# Patient Record
Sex: Female | Born: 1980 | Race: White | Hispanic: No | Marital: Married | Smoking: Never smoker
Health system: Southern US, Community
[De-identification: ages and names within clinical notes are randomized; demographics above are authoritative.]

## PROBLEM LIST (undated history)

## (undated) DIAGNOSIS — R3129 Other microscopic hematuria: Secondary | ICD-10-CM

## (undated) DIAGNOSIS — J45909 Unspecified asthma, uncomplicated: Secondary | ICD-10-CM

## (undated) DIAGNOSIS — E041 Nontoxic single thyroid nodule: Secondary | ICD-10-CM

## (undated) HISTORY — DX: Nontoxic single thyroid nodule: E04.1

## (undated) HISTORY — DX: Other microscopic hematuria: R31.29

---
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
## 2005-01-08 ENCOUNTER — Other Ambulatory Visit: Admission: RE | Admit: 2005-01-08 | Discharge: 2005-01-08 | Payer: Self-pay | Admitting: *Deleted

## 2005-01-19 ENCOUNTER — Other Ambulatory Visit: Admission: RE | Admit: 2005-01-19 | Discharge: 2005-01-19 | Payer: Self-pay | Admitting: Family Medicine

## 2006-02-06 ENCOUNTER — Other Ambulatory Visit: Admission: RE | Admit: 2006-02-06 | Discharge: 2006-02-06 | Payer: Self-pay | Admitting: *Deleted

## 2007-01-01 ENCOUNTER — Other Ambulatory Visit: Admission: RE | Admit: 2007-01-01 | Discharge: 2007-01-01 | Payer: Self-pay | Admitting: Unknown Physician Specialty

## 2007-05-28 ENCOUNTER — Other Ambulatory Visit: Admission: RE | Admit: 2007-05-28 | Discharge: 2007-05-28 | Payer: Self-pay | Admitting: *Deleted

## 2008-06-03 ENCOUNTER — Other Ambulatory Visit: Admission: RE | Admit: 2008-06-03 | Discharge: 2008-06-03 | Payer: Self-pay | Admitting: Family Medicine

## 2009-07-28 LAB — CONVERTED CEMR LAB: Pap Smear: NORMAL

## 2009-07-29 ENCOUNTER — Other Ambulatory Visit: Admission: RE | Admit: 2009-07-29 | Discharge: 2009-07-29 | Payer: Self-pay | Admitting: Family Medicine

## 2010-07-24 ENCOUNTER — Other Ambulatory Visit: Admission: RE | Admit: 2010-07-24 | Discharge: 2010-07-24 | Payer: Self-pay | Admitting: Family Medicine

## 2010-07-24 ENCOUNTER — Ambulatory Visit: Payer: Self-pay | Admitting: Family Medicine

## 2010-07-24 DIAGNOSIS — E041 Nontoxic single thyroid nodule: Secondary | ICD-10-CM

## 2010-07-24 HISTORY — DX: Nontoxic single thyroid nodule: E04.1

## 2010-07-24 LAB — HM PAP SMEAR

## 2010-07-24 LAB — CONVERTED CEMR LAB
HDL: 42.2 mg/dL (ref >39.00)
Hgb A1c MFr Bld: 5.4 % (ref 4.6–6.5)
Total CHOL/HDL Ratio: 5
Triglycerides: 100 mg/dL (ref 0.0–149.0)
VLDL: 20 mg/dL (ref 0.0–40.0)

## 2010-07-27 ENCOUNTER — Encounter: Payer: Self-pay | Admitting: Family Medicine

## 2010-07-27 ENCOUNTER — Encounter: Admission: RE | Admit: 2010-07-27 | Discharge: 2010-07-27 | Payer: Self-pay | Admitting: Family Medicine

## 2010-09-13 ENCOUNTER — Telehealth: Payer: Self-pay | Admitting: Family Medicine

## 2010-10-24 NOTE — Assessment & Plan Note (Signed)
Summary: NEW PT TO EST/CPX/CLE   Vital Signs:  Patient profile:   30 year old female Height:      68 inches Weight:      120 pounds BMI:     18.31 Temp:     98.0 degrees F oral Pulse rate:   68 / minute Pulse rhythm:   regular BP sitting:   110 / 70  (left arm) Cuff size:   regular  Vitals Entered By: Linde Gillis CMA Duncan Dull) (July 24, 2010 9:55 AM) CC: new patient, establish care   History of Present Illness: 30 yo here to establish care and for CPX.  1.  Thyroid cyst- per pt, found in 2005.  Was being followed every six months by ultrasound but it shrank.  Pt has not had an ultrasound in at least a year.  Denies any dysphagia but she is concerned that she is having symptoms of thyroid irregularity- unintentional 7 pound weight loss in last 3 months, hot flashes, increased frequency of stools.  Denies any palpitations, anxiety or mood changes.   Sister has h/o hypothyroidism.  2.  Well woman- has been on OCPs for years.  She and her husband are talking about starting a family soon. h/o abnormal pap smear less than five years ago, most recent ones have been normal.  Denies any vaginal discharge, dysuria or pain with intercourse.    Preventive Screening-Counseling & Management  Alcohol-Tobacco     Smoking Status: never      Drug Use:  no.    Current Medications (verified): 1)  Junel 1.5/30 1.5-30 Mg-Mcg Tabs (Norethindrone Acet-Ethinyl Est) .... Take As Directed 2)  Ranitidine Hcl 150 Mg Tabs (Ranitidine Hcl) .... Take One Tablet By Mouth Daily  Allergies (verified): 1)  ! 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 Married Never Smoked Alcohol use-no Drug use-no Smoking Status:  never Drug Use:  no  Review of Systems      See HPI General:  Denies fever and malaise. Eyes:  Denies blurring. ENT:  Denies difficulty swallowing. CV:  Denies chest pain or discomfort, near fainting, and palpitations. Resp:  Denies shortness of breath. GI:  Complains of diarrhea; denies abdominal pain and bloody stools. GU:  Denies abnormal vaginal bleeding and dysuria. MS:  Denies joint pain, joint redness, and joint swelling. Derm:  Denies rash. Neuro:  Denies headaches. Psych:  Denies anxiety and depression. Endo:  Complains of heat intolerance and weight change; denies polyuria. Heme:  Denies abnormal bruising and bleeding.  Physical Exam  General:  alert, well-developed, well-nourished, and well-hydrated.   Head:  normocephalic and atraumatic.   Eyes:  vision grossly intact, pupils equal, pupils round, and pupils reactive to light.   Ears:  R ear normal and L ear normal.   Nose:  no external deformity.   Mouth:  good dentition and no gingival abnormalities.   Neck:  No deformities, masses, or tenderness noted. (unable to palpate a mass) Lungs:  Normal respiratory effort, chest expands symmetrically. Lungs are clear to auscultation, no crackles or wheezes. Heart:  Normal rate and regular rhythm. S1 and S2 normal without gallop, murmur, click, rub or other  extra sounds. Abdomen:  Bowel sounds positive,abdomen soft and non-tender without masses, organomegaly or hernias noted. Rectal:  no external abnormalities and no hemorrhoids.   Genitalia:  Pelvic Exam:        External: normal female genitalia without lesions or masses        Vagina: normal without lesions or masses        Cervix: normal without lesions or masses        Adnexa: normal bimanual exam without masses or fullness        Uterus: normal by palpation        Pap smear:  performed Msk:  No deformity or scoliosis noted of thoracic or lumbar spine.   Extremities:  no edema Neurologic:  alert & oriented X3 and gait normal.   Skin:  Intact without suspicious lesions or rashes Psych:  Cognition and judgment appear intact. Alert and cooperative with normal attention span and concentration. No apparent delusions, illusions, hallucinations   Impression & Recommendations:  Problem # 1:  Preventive Health Care (ICD-V70.0) Reviewed preventive care protocols, scheduled due services, and updated immunizations Discussed nutrition, exercise, diet, and healthy lifestyle.  Pap smear today. FLP, a1c today.    Problem # 2:  THYROID CYST (ICD-246.2) Assessment: Unchanged will send for ultrasound. Orders: Radiology Referral (Radiology)  Problem # 3:  R/O HYPERTHYROIDISM (ICD-242.90) Assessment: New TSH, T3, FT3 today.   Orders: Venipuncture (82956) TLB-TSH (Thyroid Stimulating Hormone) (84443-TSH) TLB-T3, Free (Triiodothyronine) (84481-T3FREE) TLB-T4 (Thyrox), Free [phone])  Complete Medication List: 1)  Junel 1.5/30 1.5-30 Mg-mcg Tabs (Norethindrone acet-ethinyl est) .... Take as directed 2)  Ranitidine Hcl 150 Mg Tabs (Ranitidine hcl) .... Take one tablet by mouth daily  Other Orders: TLB-Lipid Panel (80061-LIPID) TLB-A1C / Hgb A1C (Glycohemoglobin) (83036-A1C)  Patient Instructions: 1)  Great to meet you, Baxter Hire. 2)  Please stop by to see Shirlee Limerick on your way out. Prescriptions: JUNEL 1.5/30 1.5-30 MG-MCG TABS (NORETHINDRONE ACET-ETHINYL EST) take as directed  #1 x 11   Entered and Authorized by:   Ruthe Mannan MD   Signed by:   Ruthe Mannan MD on 07/24/2010   Method used:   Electronically to        [facility]  [address] [id number] (retail)       [address]       [place], [place]  [address]       Ph: [phone] or [phone]       Fax: [phone]   RxID:   [id number]    Orders Added: 1)  Venipuncture [36415] 2)  TLB-TSH (Thyroid  Stimulating Hormone) [84443-TSH] 3)  TLB-T3, Free (Triiodothyronine) [06301-S0FUXN] 4)  TLB-T4 (Thyrox), Free [23557-DU2G] 5)  TLB-Lipid Panel [80061-LIPID] 6)  TLB-A1C / Hgb A1C (Glycohemoglobin) [83036-A1C] 7)  Radiology Referral [Radiology] 8)  New Patient 18-39 years [99385] 63)  New Patient Level III [99203]    Current Allergies (reviewed today): ! PENICILLIN  Flu Vaccine Result Date:  07/24/2010 Flu Vaccine Result:  given TD Result Date:  07/17/2005 TD Result:  historical PAP Result Date:  07/28/2009 PAP Result:  normal- historical   Appended Document: NEW PT TO EST/CPX/CLE     Allergies: 1)  ! Penicillin   Complete Medication List: 1)  Junel 1.5/30 1.5-30 Mg-mcg Tabs (Norethindrone acet-ethinyl est) .... Take as directed 2)  Ranitidine Hcl 150 Mg Tabs (Ranitidine hcl) .... Take one tablet by mouth daily  Other Orders: Admin 1st Vaccine (25427) Flu Vaccine 43yrs + (06237)   Orders Added: 1)  Admin 1st Vaccine [  90471] 2)  Flu Vaccine 57yrs + [09811]    Flu Vaccine Consent Questions     Do you have a history of severe allergic reactions to this vaccine? no    Any prior history of allergic reactions to egg and/or gelatin? no    Do you have a sensitivity to the preservative Thimersol? no    Do you have a past history of Guillan-Barre Syndrome? no    Do you currently have an acute febrile illness? no    Have you ever had a severe reaction to latex? no    Vaccine information given and explained to patient? yes    Are you currently pregnant? no    Lot Number:[id number]   Exp Date:03/24/2011   Site Given  Left Deltoid [email or web address]  Appended Document: NEW PT TO EST/CPX/CLE Flu vaccination given in right arm.

## 2010-10-24 NOTE — Letter (Signed)
Summary: Results Follow up Letter  [facility]  [address] [place], [place] [address]   Phone: [phone]  Fax: [phone]    07/27/2010 MRN: [id number]  Susan Hurst [address] [place], [place]  [address]  Dear Ms. Kouba,  The following are the results of your recent test(s):  Test         Result    Pap Smear:        Normal ___X__  Not Normal _____ Comments: ______________________________________________________ Cholesterol: LDL(Bad cholesterol):         Your goal is less than:         HDL (Good cholesterol):       Your goal is more than: Comments:  ______________________________________________________ Mammogram:        Normal _____  Not Normal _____ Comments:  ___________________________________________________________________ Hemoccult:        Normal _____  Not normal _______ Comments:    _____________________________________________________________________ Other Tests:    We routinely do not discuss normal results over the telephone.  If you desire a copy of the results, or you have any questions about this information we can discuss them at your next office visit.   Sincerely,      Ruthe Mannan, MD

## 2010-10-24 NOTE — Letter (Signed)
Summary: Generic Letter  [place] [facility]  [address] [place], [place] [address]   Phone: [phone]  Fax: [phone]    07/24/2010  Susan Hurst [address] [place], [place]  [address]  Dear [name],    We have received your lab results and Dr. Dayton Martes says that your thyroid function is normal.  Triglycerides and HDL (good cholesterol) is great.  LDL (bad cholesterol) is a little elevated, normal for your age and risk factors.           Sincerely,       Linde Gillis CMA (AAMA)for Dr. Ruthe Mannan

## 2010-10-24 NOTE — Letter (Signed)
Summary: Records Dated 06-03-08 thru 07-29-09/[facility]  Records Dated 06-03-08 thru 07-29-09/[facility]   Imported By: Lanelle Bal 08/03/2010 10:39:11  _____________________________________________________________________  External Attachment:    Type:   Image     Comment:   External Document

## 2010-10-26 NOTE — Progress Notes (Signed)
Summary: strange odor to urine  Phone Note Call from Patient Call back at Home Phone [phone]   Caller: Patient Call For: Ruthe Mannan MD Summary of Call: Pt states she has had a bad odor to her urine for about 3 months.  She has no other symptoms- no burning, pain, frequency.  She describes this as an ammonia smell.  The only thing she has changed in her diet is the vitamins that she takes- changed brands back in the summer.  Please advise. Initial call taken by: Lowella Petties CMA, AAMA,  September 13, 2010 4:12 PM  Follow-up for Phone Call        Likely due to the vitamis but I can see her next week to check a UA to reassure her. Ruthe Mannan MD  September 14, 2010 5:11 AM  [facility] for pt to call.              Lowella Petties CMA, AAMA  September 14, 2010 10:37 AM  [facility] for pt to call to schedule appt.           Lowella Petties CMA, AAMA  September 14, 2010 2:32 PM

## 2010-11-09 ENCOUNTER — Ambulatory Visit: Payer: Self-pay | Admitting: Family Medicine

## 2010-11-10 ENCOUNTER — Ambulatory Visit (INDEPENDENT_AMBULATORY_CARE_PROVIDER_SITE_OTHER): Payer: BC Managed Care – PPO | Admitting: Family Medicine

## 2010-11-10 ENCOUNTER — Encounter: Payer: Self-pay | Admitting: Family Medicine

## 2010-11-10 DIAGNOSIS — R300 Dysuria: Secondary | ICD-10-CM

## 2010-11-10 DIAGNOSIS — R634 Abnormal weight loss: Secondary | ICD-10-CM | POA: Insufficient documentation

## 2010-11-10 DIAGNOSIS — R3129 Other microscopic hematuria: Secondary | ICD-10-CM

## 2010-11-10 HISTORY — DX: Other microscopic hematuria: R31.29

## 2010-11-10 LAB — CONVERTED CEMR LAB
Ketones, urine, test strip: NEGATIVE
Nitrite: NEGATIVE
Urobilinogen, UA: 0.2

## 2010-11-11 ENCOUNTER — Encounter: Payer: Self-pay | Admitting: Family Medicine

## 2010-11-14 ENCOUNTER — Encounter (INDEPENDENT_AMBULATORY_CARE_PROVIDER_SITE_OTHER): Payer: Self-pay | Admitting: *Deleted

## 2010-11-14 LAB — CONVERTED CEMR LAB
AST: 11 units/L (ref 0–37)
Albumin: 4.6 g/dL (ref 3.5–5.2)
BUN: 8 mg/dL (ref 6–23)
Basophils Relative: 0 % (ref 0–1)
Bilirubin, Direct: 0.1 mg/dL (ref 0.0–0.3)
Calcium: 9.3 mg/dL (ref 8.4–10.5)
Cortisol, Plasma: 15.1 ug/dL
Creatinine, Ser: 0.75 mg/dL (ref 0.40–1.20)
Eosinophils Absolute: 0 10*3/uL (ref 0.0–0.7)
Eosinophils Relative: 1 % (ref 0–5)
Glucose, Bld: 96 mg/dL (ref 70–99)
HCT: 43.4 % (ref 36.0–46.0)
Hgb A1c MFr Bld: 4.9 % (ref <5.7)
Lymphs Abs: 2.1 10*3/uL (ref 0.7–4.0)
MCHC: 34.1 g/dL (ref 30.0–36.0)
MCV: 92.3 fL (ref 78.0–100.0)
Monocytes Relative: 8 % (ref 3–12)
Neutrophils Relative %: 59 % (ref 43–77)
Potassium: 4.3 meq/L (ref 3.5–5.3)
RBC: 4.7 M/uL (ref 3.87–5.11)
TSH: 1.111 microintl units/mL (ref 0.350–4.500)
Total Bilirubin: 0.6 mg/dL (ref 0.3–1.2)

## 2010-11-15 NOTE — Assessment & Plan Note (Signed)
Summary: Susan Hurst SMELL IN URINE/CLE   Vital Signs:  Patient profile:   30 year old female Height:      68 inches Weight:      119.50 pounds BMI:     18.24 Temp:     97.9 degrees F oral Pulse rate:   76 / minute Pulse rhythm:   regular BP sitting:   102 / 78  (right arm) Cuff size:   regular  Vitals Entered By: Linde Gillis CMA Duncan Dull) (November 10, 2010 2:39 PM) CC: losing weight, urine odor   History of Present Illness: 30 yo here for weight loss and foul smelling urine.  Weight is stable since her last appointment in October according to our scale but per pt's home scale, she has lost 5 pounds.  She has been concerned about gradually losing weight over the past year.   Has a h/o thyroid cyst that was stable on ultrasound in october 2011. Labs in October 2011 were also within normal limits including TSH, T3, FT4, a1c and lipid panel.  Foul smelling urine- has noticed it for past several months.  Thought it was her MVI but stopped taking it and still experiencing a strong smell.  No dysuria but has had increased urinary frequency. No vaginal discharge.    Current Medications (verified): 1)  Junel 1.5/30 1.5-30 Mg-Mcg Tabs (Norethindrone Acet-Ethinyl Est) .... Take As Directed 2)  Ranitidine Hcl 150 Mg Tabs (Ranitidine Hcl) .... Take One Tablet By Mouth Daily  Allergies: 1)  ! 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 CV:  Denies chest pain or discomfort. Resp:  Denies shortness of breath. Derm:  Denies  rash. Endo:  Denies cold intolerance and heat intolerance.  Physical Exam  General:  alert, well-developed, well-nourished, and well-hydrated.   Neck:  No deformities, masses, or tenderness noted. Lungs:  Normal respiratory effort, chest expands symmetrically. Lungs are clear to auscultation, no crackles or wheezes. Heart:  Normal rate and regular rhythm. S1 and S2 normal without gallop, murmur, click, rub or other extra sounds. Abdomen:  Bowel sounds positive,abdomen soft and non-tender without masses, organomegaly or hernias noted. Skin:  Intact without suspicious lesions or rashes Psych:  Cognition and judgment appear intact. Alert and cooperative with normal attention span and concentration. No apparent delusions, illusions, hallucinations   Impression & Recommendations:  Problem # 1:  LOSS OF WEIGHT (ICD-783.21) Assessment New etiology unknown and weight stable according to our scales. will recheck labs today.  No changes in bowels or feeling full, no GI referral necessary at this point. ?type of food insensitivity, i.e gluten. Orders: Venipuncture ([address]) TLB-BMP (Basic Metabolic Panel-BMET) (80048-METABOL) TLB-CBC Platelet - w/Differential (85025-CBCD) TLB-TSH (Thyroid Stimulating Hormone) (84443-TSH) TLB-Cortisol (82533-CORT) TLB-A1C / Hgb A1C (Glycohemoglobin) (83036-A1C) TLB-Hepatic/Liver Function Pnl (80076-HEPATIC)  Problem # 2:  MICROSCOPIC HEMATURIA (ICD-599.72) Assessment: New with foul smelling urine. will send urine for culture and refuer to urology to rule out bladder polyps, IC, etc. Orders: Urology Referral (Urology)  Complete Medication List: 1)  Junel 1.5/30 1.5-30 Mg-mcg Tabs (Norethindrone acet-ethinyl est) .... Take as directed 2)  Ranitidine Hcl 150 Mg Tabs (  Ranitidine hcl) .... Take one tablet by mouth daily  Other Orders: UA Dipstick w/o Micro (manual) (44010) Specimen Handling (99000) T-Urine Culture (Spectrum Order) [phone])  Patient  Instructions: 1)  please stop by to see Shirlee Limerick on your way out.   Orders Added: 1)  UA Dipstick w/o Micro (manual) [81002] 2)  Specimen Handling [99000] 3)  T-Urine Culture (Spectrum Order) [[id number]] 4)  Urology Referral [Urology] 5)  Venipuncture [87564] 6)  TLB-BMP (Basic Metabolic Panel-BMET) [80048-METABOL] 7)  TLB-CBC Platelet - w/Differential [85025-CBCD] 8)  TLB-TSH (Thyroid Stimulating Hormone) [84443-TSH] 9)  TLB-Cortisol [82533-CORT] 10)  TLB-A1C / Hgb A1C (Glycohemoglobin) [83036-A1C] 11)  TLB-Hepatic/Liver Function Pnl [80076-HEPATIC] 12)  Est. Patient Level IV [33295]    Current Allergies (reviewed today): ! PENICILLIN  Laboratory Results   Urine Tests  Date/Time Received: November 10, 2010 2:54 PM   Routine Urinalysis   Color: yellow Appearance: Clear Glucose: negative   (Normal Range: Negative) Bilirubin: negative   (Normal Range: Negative) Ketone: negative   (Normal Range: Negative) Spec. Gravity: <1.005   (Normal Range: 1.003-1.035) Blood: trace-intact   (Normal Range: Negative) pH: 6.0   (Normal Range: 5.0-8.0) Protein: trace   (Normal Range: Negative) Urobilinogen: 0.2   (Normal Range: 0-1) Nitrite: negative   (Normal Range: Negative) Leukocyte Esterace: negative   (Normal Range: Negative)        Appended Document: LOSING WEIGHT,STRONG SMELL IN URINE/CLE

## 2010-11-16 ENCOUNTER — Encounter: Payer: Self-pay | Admitting: Family Medicine

## 2010-11-21 NOTE — Miscellaneous (Signed)
Summary: med list update, bactrim  Clinical Lists Changes  Medications: Added new medication of BACTRIM DS 800-160 MG TABS (SULFAMETHOXAZOLE-TRIMETHOPRIM) take one by mouth twice a day times 3 days     Prior Medications: JUNEL 1.5/30 1.5-30 MG-MCG TABS (NORETHINDRONE ACET-ETHINYL EST) take as directed RANITIDINE HCL 150 MG TABS (RANITIDINE HCL) take one tablet by mouth daily Current Allergies: ! PENICILLIN

## 2010-12-05 NOTE — Letter (Signed)
Summary: [facility]  [facility]   Imported By: Kassie Mends 11/27/2010 10:50:08  _____________________________________________________________________  External Attachment:    Type:   Image     Comment:   External Document  Appended Document: [facility] renal U/S normal. started on abx, return for cystoscopy.

## 2011-03-30 ENCOUNTER — Ambulatory Visit (INDEPENDENT_AMBULATORY_CARE_PROVIDER_SITE_OTHER): Payer: BC Managed Care – PPO | Admitting: Family Medicine

## 2011-03-30 ENCOUNTER — Encounter: Payer: Self-pay | Admitting: Family Medicine

## 2011-03-30 VITALS — BP 120/80 | HR 66 | Temp 98.9°F | Wt 121.8 lb

## 2011-03-30 DIAGNOSIS — N390 Urinary tract infection, site not specified: Secondary | ICD-10-CM | POA: Insufficient documentation

## 2011-03-30 LAB — POCT URINALYSIS DIPSTICK
Bilirubin, UA: NEGATIVE
Leukocytes, UA: NEGATIVE
Nitrite, UA: NEGATIVE
Protein, UA: NEGATIVE
Urobilinogen, UA: 0.2
pH, UA: 6

## 2011-03-30 MED ORDER — SULFAMETHOXAZOLE-TRIMETHOPRIM 800-160 MG PO TABS: 1.0000 | ORAL_TABLET | Freq: Two times a day (BID) | ORAL | Status: AC

## 2011-03-30 NOTE — Progress Notes (Signed)
Dysuria: foul odor in urine. Initially with burning with urination- now episodic.  No pressure or frequency duration of symptoms: ~1 month abdominal pain:no fevers:no back pain:no vomiting:no other concerns:no Prev neg cystoscopy prev.    Meds, vitals, and allergies reviewed.   ROS: See HPI.  Otherwise negative.    GEN: nad, alert and oriented HEENT: mucous membranes moist NECK: supple CV: rrr.  PULM: ctab, no inc wob ABD: soft, +bs, suprapubic area not tender EXT: no edema SKIN: no acute rash BACK: no CVA pain

## 2011-03-30 NOTE — Patient Instructions (Signed)
Start the antibiotics today and we'll notify you about the culture report.  Take care and drink plenty of fluids.

## 2011-04-01 NOTE — Assessment & Plan Note (Signed)
Check ucx, start abx and f/u prn.  D/w pt, she understood.

## 2011-04-02 LAB — URINE CULTURE

## 2011-07-04 ENCOUNTER — Other Ambulatory Visit: Payer: Self-pay | Admitting: Family Medicine

## 2011-08-23 ENCOUNTER — Encounter: Payer: Self-pay | Admitting: Family Medicine

## 2011-08-23 ENCOUNTER — Ambulatory Visit (INDEPENDENT_AMBULATORY_CARE_PROVIDER_SITE_OTHER): Payer: BC Managed Care – PPO | Admitting: Family Medicine

## 2011-08-23 VITALS — BP 128/80 | HR 65 | Temp 98.5°F | Wt 120.0 lb

## 2011-08-23 DIAGNOSIS — N390 Urinary tract infection, site not specified: Secondary | ICD-10-CM

## 2011-08-23 LAB — POCT URINALYSIS DIPSTICK
Bilirubin, UA: NEGATIVE
Glucose, UA: NEGATIVE
Ketones, UA: NEGATIVE
Nitrite, UA: NEGATIVE
pH, UA: 7.5

## 2011-08-23 MED ORDER — CIPROFLOXACIN HCL 500 MG PO TABS: 500.0000 mg | ORAL_TABLET | Freq: Two times a day (BID) | ORAL | Status: AC

## 2011-08-23 NOTE — Patient Instructions (Signed)

## 2011-08-23 NOTE — Progress Notes (Signed)
SUBJECTIVE: Susan Hurst is a 30 y.o. female who complains of urinary frequency, urgency and dysuria x 7  days, without flank pain, fever, chills, or abnormal vaginal discharge or bleeding.   Patient Active Problem List  Diagnoses  . THYROID CYST  . MICROSCOPIC HEMATURIA  . LOSS OF WEIGHT  . UTI (lower urinary tract infection)   Past Medical History  Diagnosis Date  . Thyroid cyst    No past surgical history on file. History  Substance Use Topics  . Smoking status: Never Smoker   . Smokeless tobacco: Not on file  . Alcohol Use: No   Family History  Problem Relation Age of Onset  . Hyperlipidemia Father   . Hypertension Father   . Polycystic ovary syndrome Sister   . Hypothyroidism Sister    Allergies  Allergen Reactions  . Penicillins     REACTION: hives, rash   Current Outpatient Prescriptions on File Prior to Visit  Medication Sig Dispense Refill  . JUNEL 1.5/30 1.5-30 MG-MCG tablet TAKE AS DIRECTED  21 tablet  11  . norethindrone-ethinyl estradiol-iron (JUNEL FE 1.5/30) 1.5-30 MG-MCG tablet Take 1 tablet by mouth daily.        . ranitidine (ZANTAC) 150 MG tablet Take 150 mg by mouth 2 (two) times daily.         The PMH, PSH, Social History, Family History, Medications, and allergies have been reviewed in [facility], and have been updated if relevant.  OBJECTIVE:  BP 128/80  Pulse 65  Temp(Src) 98.5 F (36.9 C) (Oral)  Wt 120 lb (54.432 kg)  LMP 07/23/2011  Appears well, in no apparent distress.  Vital signs are normal. The abdomen is soft without tenderness, guarding, mass, rebound or organomegaly. No CVA tenderness or inguinal adenopathy noted. Urine dipstick shows positive for RBC's, positive for protein and positive for leukocytes.  Micro exam: not done.   ASSESSMENT: UTI uncomplicated without evidence of pyelonephritis  PLAN: Treatment per orders - cipro 500 mg twice daily x 5 days, also push fluids, may use Pyridium OTC prn. Call or return to clinic prn if  these symptoms worsen or fail to improve as anticipated.

## 2011-08-25 LAB — URINE CULTURE

## 2011-08-29 ENCOUNTER — Encounter: Payer: BC Managed Care – PPO | Admitting: Family Medicine

## 2011-12-12 ENCOUNTER — Encounter: Payer: Self-pay | Admitting: Family Medicine

## 2011-12-12 ENCOUNTER — Ambulatory Visit (INDEPENDENT_AMBULATORY_CARE_PROVIDER_SITE_OTHER): Payer: BC Managed Care – PPO | Admitting: Family Medicine

## 2011-12-12 VITALS — BP 110/70 | HR 60 | Temp 98.0°F | Ht 68.0 in | Wt 117.0 lb

## 2011-12-12 DIAGNOSIS — Z136 Encounter for screening for cardiovascular disorders: Secondary | ICD-10-CM

## 2011-12-12 DIAGNOSIS — Z0000 Encounter for general adult medical examination without abnormal findings: Secondary | ICD-10-CM | POA: Insufficient documentation

## 2011-12-12 LAB — COMPREHENSIVE METABOLIC PANEL
Albumin: 4.2 g/dL (ref 3.5–5.2)
BUN: 10 mg/dL (ref 6–23)
Calcium: 9.1 mg/dL (ref 8.4–10.5)
Chloride: 103 mEq/L (ref 96–112)
Creatinine, Ser: 0.8 mg/dL (ref 0.4–1.2)
Glucose, Bld: 85 mg/dL (ref 70–99)
Potassium: 4 mEq/L (ref 3.5–5.1)

## 2011-12-12 LAB — LIPID PANEL
Cholesterol: 190 mg/dL (ref 0–200)
Triglycerides: 65 mg/dL (ref 0.0–149.0)
VLDL: 13 mg/dL (ref 0.0–40.0)

## 2011-12-12 NOTE — Progress Notes (Signed)
  Subjective:    Patient ID: Susan Hurst, female    DOB: 1980-10-02, 31 y.o.   MRN: [id number]  HPI 31 yo here to fill out wellness forms for work.  Doing well, no complaints. Brings form in with her today- needs glucose, lipid panel, cotitine labs.  Patient Active Problem List  Diagnoses  . THYROID CYST  . MICROSCOPIC HEMATURIA  . LOSS OF WEIGHT  . UTI (lower urinary tract infection)  . Routine general medical examination at a health care facility   Past Medical History  Diagnosis Date  . Thyroid cyst    No past surgical history on file. History  Substance Use Topics  . Smoking status: Never Smoker   . Smokeless tobacco: Not on file  . Alcohol Use: No   Family History  Problem Relation Age of Onset  . Hyperlipidemia Father   . Hypertension Father   . Polycystic ovary syndrome Sister   . Hypothyroidism Sister    Allergies  Allergen Reactions  . Penicillins     REACTION: hives, rash   Current Outpatient Prescriptions on File Prior to Visit  Medication Sig Dispense Refill  . JUNEL 1.5/30 1.5-30 MG-MCG tablet TAKE AS DIRECTED  21 tablet  11  . ranitidine (ZANTAC) 150 MG tablet Take 150 mg by mouth 2 (two) times daily.         The PMH, PSH, Social History, Family History, Medications, and allergies have been reviewed in [facility], and have been updated if relevant.  Review of Systems    See HPI Patient reports no  vision/ hearing changes,anorexia, weight change, fever ,adenopathy, persistant / recurrent hoarseness, swallowing issues, chest pain, edema,severe memory loss, concerning skin lesions, depression, anxiety, abnormal bruising/bleeding, major joint swelling, breast masses or abnormal vaginal bleeding.    Objective:   Physical Exam BP 110/70  Pulse 60  Temp(Src) 98 F (36.7 C) (Oral)  Ht 5\' 8"  (1.727 m)  Wt 117 lb (53.071 kg)  BMI 17.79 kg/m2  LMP 11/23/2011  General:  Well-developed,well-nourished,in no acute distress; alert,appropriate and cooperative  throughout examination Head:  normocephalic and atraumatic.   Eyes:  vision grossly intact, pupils equal, pupils round, and pupils reactive to light.   Ears:  R ear normal and L ear normal.   Nose:  no external deformity.   Mouth:  good dentition.   Neck:  No deformities, masses, or tenderness noted. Lungs:  Normal respiratory effort, chest expands symmetrically. Lungs are clear to auscultation, no crackles or wheezes. Heart:  Normal rate and regular rhythm. S1 and S2 normal without gallop, murmur, click, rub or other extra sounds. Abdomen:  Bowel sounds positive,abdomen soft and non-tender without masses, organomegaly or hernias noted. Neurologic:  alert & oriented X3 and gait normal.   Skin:  Intact without suspicious lesions or rashes Psych:  Cognition and judgment appear intact. Alert and cooperative with normal attention span and concentration. No apparent delusions, illusions, hallucinations      Assessment & Plan:   1. Routine general medical examination at a health care facility  Comprehensive metabolic panel, Nicotine/cotinine metabolites Lipid Panel  Labs ordered- waiting to fill out form until we receive results.

## 2011-12-12 NOTE — Patient Instructions (Signed)
We will fax your form to the number provided and send you copy with fax confirmation. Have a great day!

## 2011-12-13 LAB — NICOTINE/COTININE METABOLITES: Cotinine: <10 ng/mL

## 2012-02-07 ENCOUNTER — Telehealth: Payer: Self-pay

## 2012-02-07 NOTE — Telephone Encounter (Signed)
Pt request size of nodule from thyroid US done in 2011. Advised pt impression was 9 mm solid nodule noted mid right lobe. Pt said that was all she needed.

## 2012-02-25 ENCOUNTER — Encounter: Payer: Self-pay | Admitting: Family Medicine

## 2012-02-25 ENCOUNTER — Ambulatory Visit (INDEPENDENT_AMBULATORY_CARE_PROVIDER_SITE_OTHER): Payer: BC Managed Care – PPO | Admitting: Family Medicine

## 2012-02-25 VITALS — BP 118/70 | HR 80 | Temp 98.9°F | Resp 20 | Ht 68.0 in | Wt 115.5 lb

## 2012-02-25 DIAGNOSIS — J069 Acute upper respiratory infection, unspecified: Secondary | ICD-10-CM | POA: Insufficient documentation

## 2012-02-25 MED ORDER — AZITHROMYCIN 250 MG PO TABS: ORAL_TABLET | ORAL | Status: AC

## 2012-02-25 MED ORDER — FLUTICASONE PROPIONATE 50 MCG/ACT NA SUSP: 2.0000 | Freq: Every day | NASAL | Status: DC

## 2012-02-25 NOTE — Progress Notes (Signed)
SUBJECTIVE:  Susan Hurst is a 31 y.o. female who complains of coryza, congestion, sneezing, sore throat, dry cough and right sinus pain for 10 days. She denies a history of anorexia, chest pain, chills, dizziness,  fevers, myalgias, shortness of breath, sweats and vomiting and denies a history of asthma. Patient denies smoke cigarettes.   Patient Active Problem List  Diagnoses  . THYROID CYST  . MICROSCOPIC HEMATURIA  . LOSS OF WEIGHT  . UTI (lower urinary tract infection)  . Routine general medical examination at a health care facility  . URI (upper respiratory infection)   Past Medical History  Diagnosis Date  . Thyroid cyst    No past surgical history on file. History  Substance Use Topics  . Smoking status: Never Smoker   . Smokeless tobacco: Not on file  . Alcohol Use: No   Family History  Problem Relation Age of Onset  . Hyperlipidemia Father   . Hypertension Father   . Polycystic ovary syndrome Sister   . Hypothyroidism Sister    Allergies  Allergen Reactions  . Penicillins     REACTION: hives, rash   Current Outpatient Prescriptions on File Prior to Visit  Medication Sig Dispense Refill  . ranitidine (ZANTAC) 150 MG tablet Take 150 mg by mouth at bedtime.       Colleen Can 1.5/30 1.5-30 MG-MCG tablet TAKE AS DIRECTED  21 tablet  11   The PMH, PSH, Social History, Family History, Medications, and allergies have been reviewed in [facility], and have been updated if relevant.  OBJECTIVE: BP 118/70  Pulse 80  Temp(Src) 98.9 F (37.2 C) (Oral)  Resp 20  Ht 5\' 8"  (1.727 m)  Wt 115 lb 8 oz (52.39 kg)  BMI 17.56 kg/m2  She appears well, vital signs are as noted. Ears normal.  Throat and pharynx normal.  Neck supple. No adenopathy in the neck. Nose is congested. Sinuses non tender. The chest is clear, without wheezes or rales.  ASSESSMENT:  sinusitis  PLAN: Given duration and progression of symptoms, will treat for bacterial sinusitis with Zpack (PCN  allergic). Symptomatic therapy suggested: push fluids, rest and return office visit prn if symptoms persist or worsen.  Call or return to clinic prn if these symptoms worsen or fail to improve as anticipated.

## 2012-02-25 NOTE — Patient Instructions (Signed)
Take Zpack as directed.  Drink lots of fluids.  Treat sympotmatically with flonase, Mucinex, nasal saline irrigation, and Tylenol/Ibuprofen. Also try claritin D or zyrtec D over the counter- two times a day as needed ( have to sign for them at pharmacy). You can use warm compresses.  Cough suppressant at night. Call if not improving as expected in 5-7 days.

## 2012-04-30 ENCOUNTER — Ambulatory Visit (INDEPENDENT_AMBULATORY_CARE_PROVIDER_SITE_OTHER): Payer: BC Managed Care – PPO | Admitting: Family Medicine

## 2012-04-30 ENCOUNTER — Encounter: Payer: Self-pay | Admitting: Family Medicine

## 2012-04-30 VITALS — BP 116/70 | HR 76 | Temp 98.1°F | Wt 121.0 lb

## 2012-04-30 DIAGNOSIS — Z3201 Encounter for pregnancy test, result positive: Secondary | ICD-10-CM | POA: Insufficient documentation

## 2012-04-30 DIAGNOSIS — Z3400 Encounter for supervision of normal first pregnancy, unspecified trimester: Secondary | ICD-10-CM | POA: Insufficient documentation

## 2012-04-30 LAB — POCT URINE PREGNANCY: Preg Test, Ur: POSITIVE

## 2012-04-30 MED ORDER — PRENATAL MULTIVITAMIN CH: 1.0000 | ORAL_TABLET | Freq: Every day | ORAL | Status: DC

## 2012-04-30 NOTE — Assessment & Plan Note (Signed)
Upreg +. Congratulated.  Happy about this, excited! Start prenatal vitamin. Avoid contact with litter box. Discussed foods to watch out for. Discussed red flags to return - ie spotting, vag bleeding, abd cramping. 6-8 glasses of water daily. Refer to OBGYN to establish care.  Pt prefers [place].

## 2012-04-30 NOTE — Progress Notes (Signed)
  Subjective:    Patient ID: Susan Hurst, female    DOB: [birth year], 31 y.o.   MRN: [id number]  HPI CC: ?pregnancy  Home test positive.  G1.  LMP 03/23/2012. OCPs stopped 12/2011.  Not really trying, but more "wait and see" with husband Last well woman exam was 06/2010, normal paps recently, a few abnormal several years ago.  Has cat at home. No smokers at home.  No breast tenderness.  No abd pain or n/v, no recent fevers/chills.  Lab Results  Component Value Date   TSH 1.111 11/10/2010   Wt Readings from Last 3 Encounters:  04/30/12 121 lb (54.885 kg)  02/25/12 115 lb 8 oz (52.39 kg)  12/12/11 117 lb (53.071 kg)    Medications and allergies reviewed and updated in chart.  Past histories reviewed and updated if relevant as below. Patient Active Problem List  Diagnosis  . THYROID CYST  . MICROSCOPIC HEMATURIA  . LOSS OF WEIGHT  . UTI (lower urinary tract infection)  . Routine general medical examination at a health care facility  . URI (upper respiratory infection)   Past Medical History  Diagnosis Date  . Thyroid cyst    No past surgical history on file. History  Substance Use Topics  . Smoking status: Never Smoker   . Smokeless tobacco: Not on file  . Alcohol Use: No   Family History  Problem Relation Age of Onset  . Hyperlipidemia Father   . Hypertension Father   . Polycystic ovary syndrome Sister   . Hypothyroidism Sister    Allergies  Allergen Reactions  . Penicillins     REACTION: hives, rash   Current Outpatient Prescriptions on File Prior to Visit  Medication Sig Dispense Refill  . ranitidine (ZANTAC) 150 MG tablet Take 150 mg by mouth at bedtime.       . fluticasone (FLONASE) 50 MCG/ACT nasal spray Place 2 sprays into the nose daily.  16 g  6  . JUNEL 1.5/30 1.5-30 MG-MCG tablet TAKE AS DIRECTED  21 tablet  11     Review of Systems Per HPI    Objective:   Physical Exam  Nursing note and vitals reviewed. Constitutional: She appears  well-developed and well-nourished. No distress.       Pleasant caucasian female  HENT:  Head: Normocephalic and atraumatic.  Mouth/Throat: Oropharynx is clear and moist. No oropharyngeal exudate.  Eyes: Conjunctivae and EOM are normal. Pupils are equal, round, and reactive to light.  Neck: Normal range of motion. Neck supple. No thyromegaly present.  Cardiovascular: Normal rate, regular rhythm, normal heart sounds and intact distal pulses.   No murmur heard. Pulmonary/Chest: Effort normal and breath sounds normal. No respiratory distress. She has no wheezes. She has no rales.  Abdominal: Bowel sounds are normal. She exhibits no distension and no mass. There is no tenderness. There is no rebound and no guarding.  Musculoskeletal: She exhibits no edema.  Lymphadenopathy:    She has no cervical adenopathy.  Skin: Skin is warm and dry. No rash noted.  Psychiatric: She has a normal mood and affect.       Assessment & Plan:

## 2012-04-30 NOTE — Patient Instructions (Addendum)
Congratulations on pregnancy! 6-8 glasses of water daily. Pass by up front to schedule appointment with OBGYN. Call us with questions. Watch for abdominal cramping/pain, spotting or bleeding, if this happens please get evaluated.   Pregnancy - First Trimester During sexual intercourse, millions of sperm go into the vagina. Only 1 sperm will penetrate and fertilize the female egg while it is in the Fallopian tube. One week later, the fertilized egg implants into the wall of the uterus. An embryo begins to develop into a baby. At 6 to 8 weeks, the eyes and face are formed and the heartbeat can be seen on ultrasound. At the end of 12 weeks (first trimester), all the baby's organs are formed. Now that you are pregnant, you will want to do everything you can to have a healthy baby. Two of the most important things are to get good prenatal care and follow your caregiver's instructions. Prenatal care is all the medical care you receive before the baby's birth. It is given to prevent, find, and treat problems during the pregnancy and childbirth. PRENATAL EXAMS  During prenatal visits, your weight, blood pressure and urine are checked. This is done to make sure you are healthy and progressing normally during the pregnancy.   A pregnant woman should gain 25 to 35 pounds during the pregnancy. However, if you are over weight or underweight, your caregiver will advise you regarding your weight.   Your caregiver will ask and answer questions for you.   Blood work, cervical cultures, other necessary tests and a Pap test are done during your prenatal exams. These tests are done to check on your health and the probable health of your baby. Tests are strongly recommended and done for HIV with your permission. This is the virus that causes AIDS. These tests are done because medications can be given to help prevent your baby from being born with this infection should you have been infected without knowing it. Blood  work is also used to find out your blood type, previous infections and follow your blood levels (hemoglobin).   Low hemoglobin (anemia) is common during pregnancy. Iron and vitamins are given to help prevent this. Later in the pregnancy, blood tests for diabetes will be done along with any other tests if any problems develop. You may need tests to make sure you and the baby are doing well.   You may need other tests to make sure you and the baby are doing well.  CHANGES DURING THE FIRST TRIMESTER (THE FIRST 3 MONTHS OF PREGNANCY) Your body goes through many changes during pregnancy. They vary from person to person. Talk to your caregiver about changes you notice and are concerned about. Changes can include:  Your menstrual period stops.   The egg and sperm carry the genes that determine what you look like. Genes from you and your partner are forming a baby. The female genes determine whether the baby is a boy or a girl.   Your body increases in girth and you may feel bloated.   Feeling sick to your stomach (nauseous) and throwing up (vomiting). If the vomiting is uncontrollable, call your caregiver.   Your breasts will begin to enlarge and become tender.   Your nipples may stick out more and become darker.   The need to urinate more. Painful urination may mean you have a bladder infection.   Tiring easily.   Loss of appetite.   Cravings for certain kinds of food.   At first, you may  gain or lose a couple of pounds.   You may have changes in your emotions from day to day (excited to be pregnant or concerned something may go wrong with the pregnancy and baby).   You may have more vivid and strange dreams.  HOME CARE INSTRUCTIONS   It is very important to avoid all smoking, alcohol and un-prescribed drugs during your pregnancy. These affect the formation and growth of the baby. Avoid chemicals while pregnant to ensure the delivery of a healthy infant.   Start your prenatal visits by  the 12th week of pregnancy. They are usually scheduled monthly at first, then more often in the last 2 months before delivery. Keep your caregiver's appointments. Follow your caregiver's instructions regarding medication use, blood and lab tests, exercise, and diet.   During pregnancy, you are providing food for you and your baby. Eat regular, well-balanced meals. Choose foods such as meat, fish, milk and other low fat dairy products, vegetables, fruits, and whole-grain breads and cereals. Your caregiver will tell you of the ideal weight gain.   You can help morning sickness by keeping soda crackers at the bedside. Eat a couple before arising in the morning. You may want to use the crackers without salt on them.   Eating 4 to 5 small meals rather than 3 large meals a day also may help the nausea and vomiting.   Drinking liquids between meals instead of during meals also seems to help nausea and vomiting.   A physical sexual relationship may be continued throughout pregnancy if there are no other problems. Problems may be early (premature) leaking of amniotic fluid from the membranes, vaginal bleeding, or belly (abdominal) pain.   Exercise regularly if there are no restrictions. Check with your caregiver or physical therapist if you are unsure of the safety of some of your exercises. Greater weight gain will occur in the last 2 trimesters of pregnancy. Exercising will help:   Control your weight.   Keep you in shape.   Prepare you for labor and delivery.   Help you lose your pregnancy weight after you deliver your baby.   Wear a good support or jogging bra for breast tenderness during pregnancy. This may help if worn during sleep too.   Ask when prenatal classes are available. Begin classes when they are offered.   Do not use hot tubs, steam rooms or saunas.   Wear your seat belt when driving. This protects you and your baby if you are in an accident.   Avoid raw meat, uncooked cheese,  cat litter boxes and soil used by cats throughout the pregnancy. These carry germs that can cause birth defects in the baby.   The first trimester is a good time to visit your dentist for your dental health. Getting your teeth cleaned is OK. Use a softer toothbrush and brush gently during pregnancy.   Ask for help if you have financial, counseling or nutritional needs during pregnancy. Your caregiver will be able to offer counseling for these needs as well as refer you for other special needs.   Do not take any medications or herbs unless told by your caregiver.   Inform your caregiver if there is any mental or physical domestic violence.   Make a list of emergency phone numbers of family, friends, hospital, and police and fire departments.   Write down your questions. Take them to your prenatal visit.   Do not douche.   Do not cross your legs.  If you have to stand for long periods of time, rotate you feet or take small steps in a circle.   You may have more vaginal secretions that may require a sanitary pad. Do not use tampons or scented sanitary pads.  MEDICATIONS AND DRUG USE IN PREGNANCY  Take prenatal vitamins as directed. The vitamin should contain 1 milligram of folic acid. Keep all vitamins out of reach of children. Only a couple vitamins or tablets containing iron may be fatal to a baby or young child when ingested.   Avoid use of all medications, including herbs, over-the-counter medications, not prescribed or suggested by your caregiver. Only take over-the-counter or prescription medicines for pain, discomfort, or fever as directed by your caregiver. Do not use aspirin, ibuprofen, or naproxen unless directed by your caregiver.   Let your caregiver also know about herbs you may be using.   Alcohol is related to a number of birth defects. This includes fetal alcohol syndrome. All alcohol, in any form, should be avoided completely. Smoking will cause low birth rate and  premature babies.   Street or illegal drugs are very harmful to the baby. They are absolutely forbidden. A baby born to an addicted mother will be addicted at birth. The baby will go through the same withdrawal an adult does.   Let your caregiver know about any medications that you have to take and for what reason you take them.  MISCARRIAGE IS COMMON DURING PREGNANCY A miscarriage does not mean you did something wrong. It is not a reason to worry about getting pregnant again. Your caregiver will help you with questions you may have. If you have a miscarriage, you may need minor surgery. SEEK MEDICAL CARE IF:  You have any concerns or worries during your pregnancy. It is better to call with your questions if you feel they cannot wait, rather than worry about them. SEEK IMMEDIATE MEDICAL CARE IF:   An unexplained oral temperature above 102 F (38.9 C) develops, or as your caregiver suggests.   You have leaking of fluid from the vagina (birth canal). If leaking membranes are suspected, take your temperature and inform your caregiver of this when you call.   There is vaginal spotting or bleeding. Notify your caregiver of the amount and how many pads are used.   You develop a bad smelling vaginal discharge with a change in the color.   You continue to feel sick to your stomach (nauseated) and have no relief from remedies suggested. You vomit blood or coffee ground-like materials.   You lose more than 2 pounds of weight in 1 week.   You gain more than 2 pounds of weight in 1 week and you notice swelling of your face, hands, feet, or legs.   You gain 5 pounds or more in 1 week (even if you do not have swelling of your hands, face, legs, or feet).   You get exposed to [place] measles and have never had them.   You are exposed to fifth disease or chickenpox.   You develop belly (abdominal) pain. Round ligament discomfort is a common non-cancerous (benign) cause of abdominal pain in pregnancy.  Your caregiver still must evaluate this.   You develop headache, fever, diarrhea, pain with urination, or shortness of breath.   You fall or are in a car accident or have any kind of trauma.   There is mental or physical violence in your home.  Document Released: 09/04/2001 Document Revised: 08/30/2011 Document Reviewed: 03/08/2009 [employer]  Patient Information 2012 [employer]. 

## 2012-05-06 ENCOUNTER — Telehealth: Payer: Self-pay

## 2012-05-06 NOTE — Telephone Encounter (Signed)
Pt left v/m was seen last week confirmed pregnant. Pt wants to know if can take Zantac while pregnant.Please advise.

## 2012-05-07 NOTE — Telephone Encounter (Signed)
Left message asking pt to call back. 

## 2012-05-07 NOTE — Telephone Encounter (Signed)
Yes compared to other antacids, it is quite safe.  It is a pregnancy category risk B (A is the safest but there are very few medications (almost none) that are category A.

## 2012-05-07 NOTE — Telephone Encounter (Signed)
Patient notified as instructed by telephone. 

## 2012-05-21 ENCOUNTER — Other Ambulatory Visit: Payer: Self-pay

## 2012-05-21 LAB — OB RESULTS CONSOLE ANTIBODY SCREEN: Antibody Screen: NEGATIVE

## 2012-05-21 LAB — OB RESULTS CONSOLE HIV ANTIBODY (ROUTINE TESTING): HIV: NONREACTIVE

## 2012-05-21 LAB — OB RESULTS CONSOLE RPR: RPR: NONREACTIVE

## 2012-05-21 LAB — OB RESULTS CONSOLE RUBELLA ANTIBODY, IGM: Rubella: IMMUNE

## 2012-06-03 LAB — OB RESULTS CONSOLE GC/CHLAMYDIA: Gonorrhea: NEGATIVE

## 2012-08-04 ENCOUNTER — Other Ambulatory Visit (HOSPITAL_COMMUNITY): Payer: Self-pay | Admitting: Obstetrics & Gynecology

## 2012-08-04 DIAGNOSIS — N2881 Hypertrophy of kidney: Secondary | ICD-10-CM

## 2012-08-06 ENCOUNTER — Ambulatory Visit (HOSPITAL_COMMUNITY)
Admission: RE | Admit: 2012-08-06 | Discharge: 2012-08-06 | Disposition: A | Discharge status: Home / Self Care | Payer: BC Managed Care – PPO | Source: Ambulatory Visit | Attending: Obstetrics & Gynecology | Admitting: Obstetrics & Gynecology

## 2012-08-06 ENCOUNTER — Encounter (HOSPITAL_COMMUNITY): Payer: Self-pay

## 2012-08-06 VITALS — BP 109/64 | HR 60 | Wt 126.0 lb

## 2012-08-06 DIAGNOSIS — O358XX0 Maternal care for other (suspected) fetal abnormality and damage, not applicable or unspecified: Secondary | ICD-10-CM | POA: Insufficient documentation

## 2012-08-06 DIAGNOSIS — Z1389 Encounter for screening for other disorder: Secondary | ICD-10-CM | POA: Insufficient documentation

## 2012-08-06 DIAGNOSIS — Z363 Encounter for antenatal screening for malformations: Secondary | ICD-10-CM | POA: Insufficient documentation

## 2012-08-06 DIAGNOSIS — N2881 Hypertrophy of kidney: Secondary | ICD-10-CM

## 2012-08-06 NOTE — Progress Notes (Signed)
Susan Hurst  was seen today for an ultrasound appointment.  See full report in [facility].  Comments: [name] is seen today due to right fetal kidney abnormality.  On ultrasound today, grade IV hydronephrosis is noted on the right with only a small rim of renal tissue noted.  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  Mild renal pylectasis is noted on the right (5 mm). The bladder appears normal.  Normal amniotic fluid volume is appreciated.  Additionally, a right choroid plexus cyst is seen (4mm).  Open hands a visualized.  The remainder of the fetal anatomy is within normal limits; however, some views of the fetal heart were limited (RVOT, aortic arch)  The findings and limitations of the study were discussed with the patient.  We briefly reviewed the association of renal pylectasis / hydroneprhosis and Down syndrome.  The patient previously declined quad screen testing.  We discussed the option of  cell-free fetal DNA testing - the patient declines further testing at this time.  The patient is aware that the newborn will likely require an evaluation by [facility] urology after delivery, but feel that she will still most likely be able to deliver at [facility].  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  Open hands were visualized. Normal amniotic fluid volume.  Recommendations: Recommend follow up ultrasound in 6 weeks to [place]. Will make arrangements for [facility] urology consult in the 3rd trimester.  Alpha Gula, MD

## 2012-09-12 ENCOUNTER — Ambulatory Visit (HOSPITAL_COMMUNITY)
Admission: RE | Admit: 2012-09-12 | Discharge: 2012-09-12 | Disposition: A | Discharge status: Home / Self Care | Payer: BC Managed Care – PPO | Source: Ambulatory Visit | Attending: Obstetrics & Gynecology | Admitting: Obstetrics & Gynecology

## 2012-09-12 DIAGNOSIS — N2881 Hypertrophy of kidney: Secondary | ICD-10-CM

## 2012-09-12 DIAGNOSIS — O358XX0 Maternal care for other (suspected) fetal abnormality and damage, not applicable or unspecified: Secondary | ICD-10-CM | POA: Insufficient documentation

## 2012-09-12 IMAGING — US US OB FOLLOW-UP
1 series · 12 of 28 positions shown · non-contrast
Comparison: none

[Series 1: us ob follow-up · 0.20mm/px · 12 of 56 slices shown]
[im 3/56]
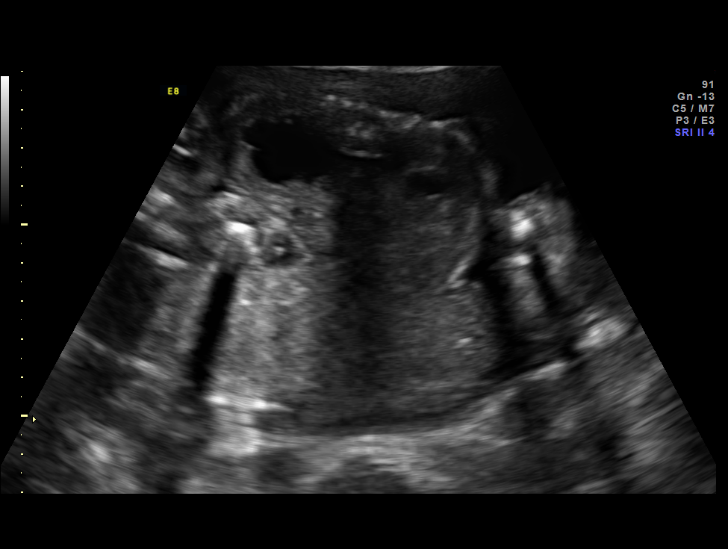
[im 7/56]
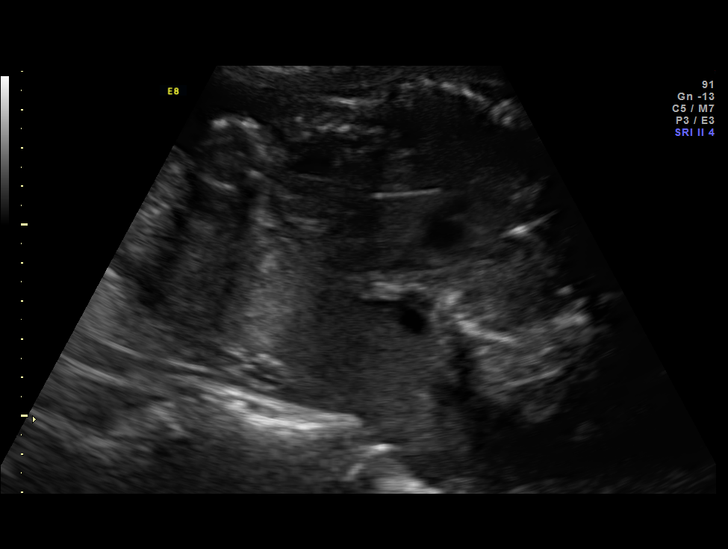
[im 11/56]
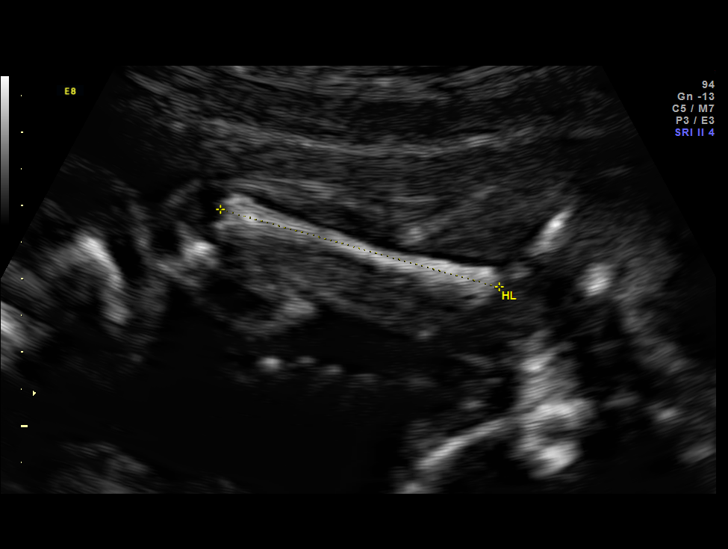
[im 17/56]
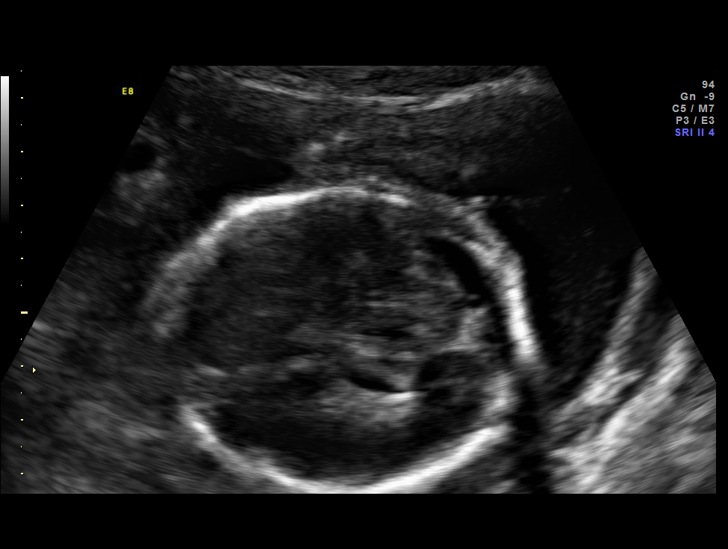
[im 21/56]
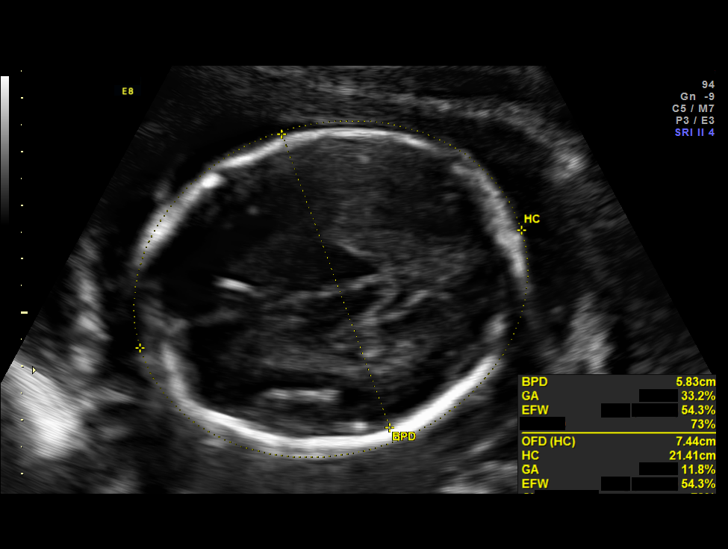
[im 25/56]
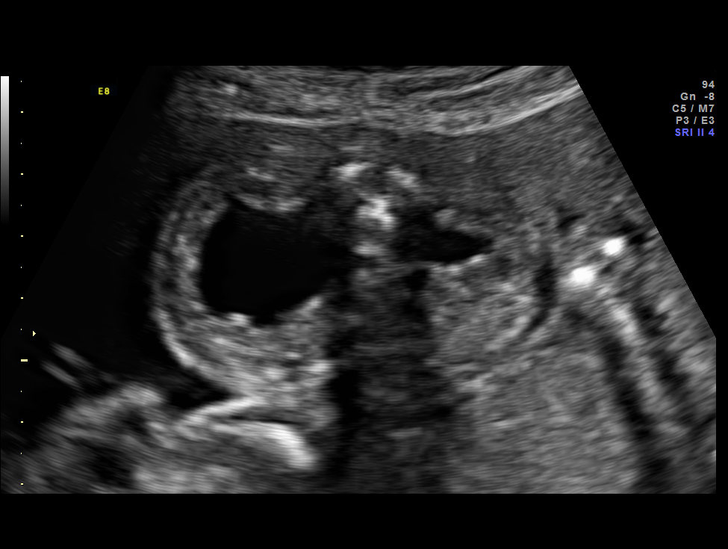
[im 31/56]
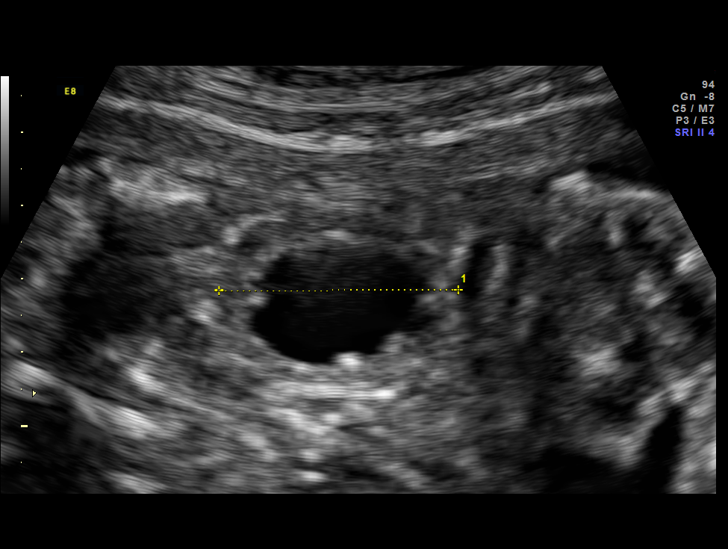
[im 35/56]
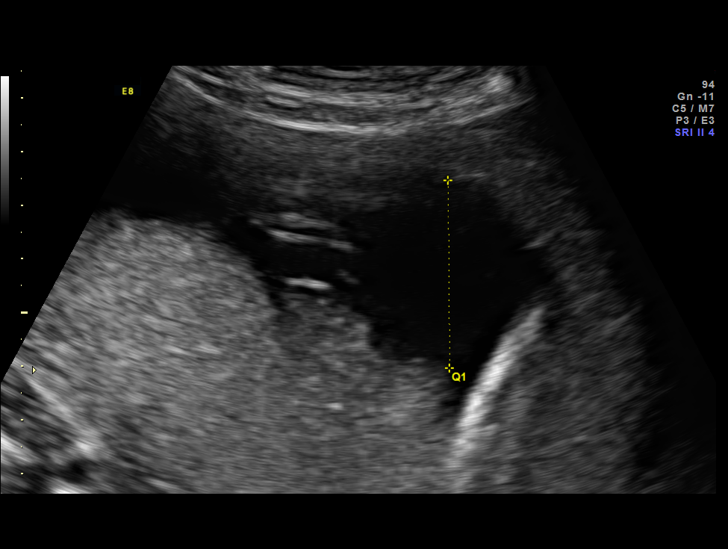
[im 39/56]
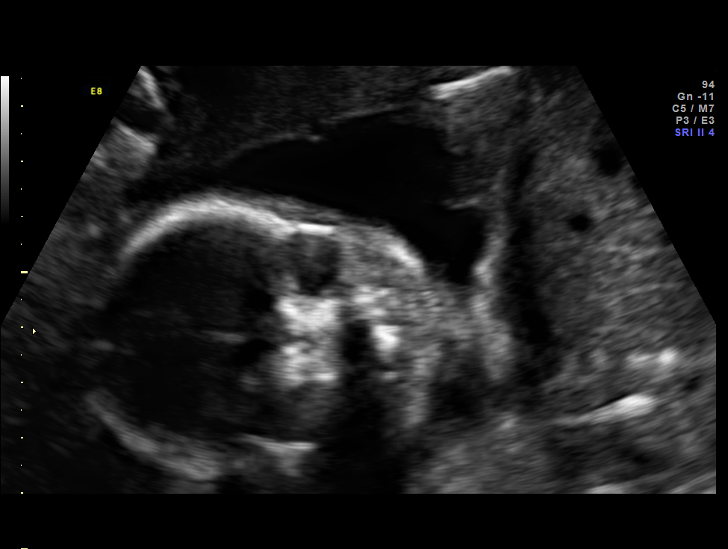
[im 45/56]
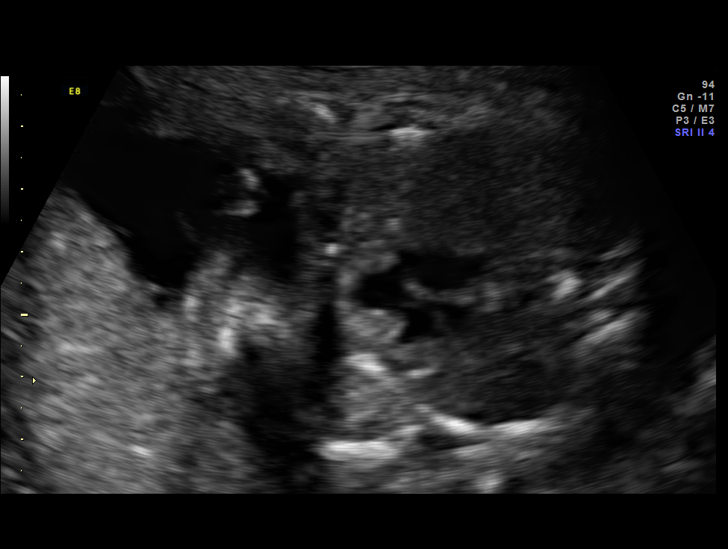
[im 49/56]
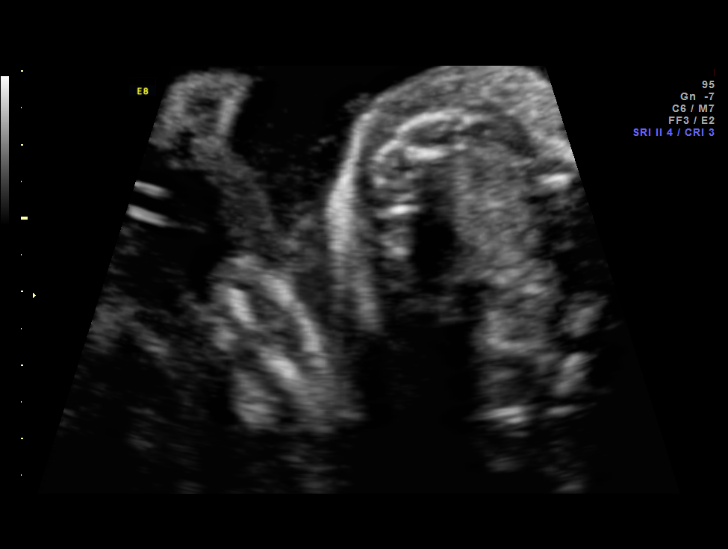
[im 53/56]
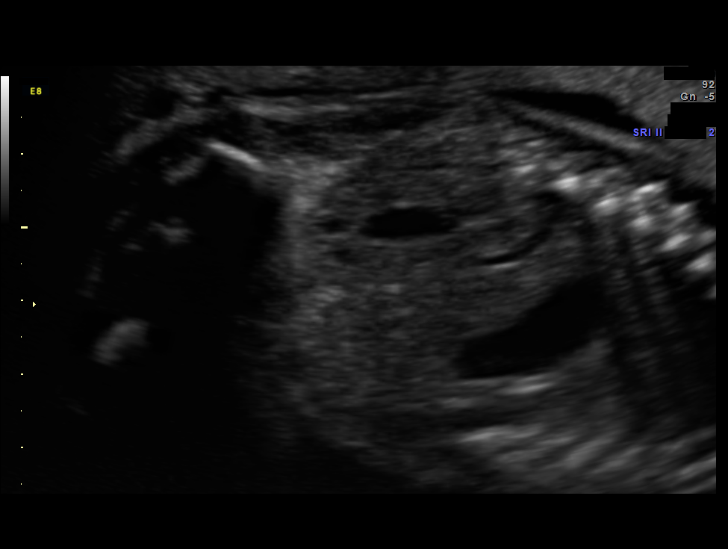

[12 of 28 positions shown; findings below may reference images not displayed]

OBSTETRICS REPORT
                      (Signed Final [DATE] [DATE])

Service(s) Provided

 US OB FOLLOW UP                                       76816.1
Indications

 Fetal abnormality - other known or suspected
 (enlarged kidneys)
Fetal Evaluation

 Num Of Fetuses:    1
 Fetal Heart Rate:  173                          bpm
 Cardiac Activity:  Observed
 Presentation:      Cephalic
 Placenta:          Posterior, above cervical
                    os
 P. Cord            Previously Visualized
 Insertion:

 Amniotic Fluid
 AFI FV:      Subjectively within normal limits
 AFI Sum:     11.42   cm       20  %Tile     Larg Pckt:     3.5  cm
 RUQ:   3.5     cm   RLQ:    2.7    cm    LUQ:   2.39    cm   LLQ:    2.83   cm
Biometry

 BPD:     57.6  mm     G. Age:  23w 4d                CI:         78.2   70 - 86
 OFD:     73.7  mm                                    FL/HC:      20.0   18.7 -

 HC:     213.4  mm     G. Age:  23w 3d      < 3  %    HC/AC:      1.05   1.04 -

 AC:     204.1  mm     G. Age:  25w 0d       51  %    FL/BPD:     74.0   71 - 87
 FL:      42.6  mm     G. Age:  24w 0d       16  %    FL/AC:      20.9   20 - 24
 HUM:     38.5  mm     G. Age:  23w 5d       17  %

 Est. FW:     690  gm      1 lb 8 oz     45  %
Gestational Age

 LMP:           24w 5d        Date:  [DATE]                 EDD:   [DATE]
 U/S Today:     24w 0d                                        EDD:   [DATE]
 Best:          24w 5d     Det. By:  LMP  ([DATE])          EDD:   [DATE]
Anatomy
 Cranium:          Previously seen        Aortic Arch:      Appears normal
 Fetal Cavum:      Previously seen        Ductal Arch:      Previously seen
 Ventricles:       Appears normal         Diaphragm:        Appears normal
 Choroid Plexus:   Resolved CPC           Stomach:          Appears normal, left
                                                            sided
 Cerebellum:       Appears normal         Abdomen:          Previously seen
 Posterior Fossa:  Previously seen        Abdominal Wall:   Previously seen
 Nuchal Fold:      Previously seen        Cord Vessels:     Previously seen
 Face:             Orbits and profile     Kidneys:          Hydronephrosis
                   previously seen
                                                            (right)
 Lips:             Previously seen        Bladder:          Appears normal
 Heart:            Appears normal         Spine:            Previously seen
                   (4CH, axis, and
                   situs)
 RVOT:             Appears normal         Lower             Previously seen
                                          Extremities:
 LVOT:             Previously seen        Upper             Previously seen
                                          Extremities:

 Other:  Female gender. Heels and 5th digit previously seen.
Cervix Uterus Adnexa

 Cervix:       Normal appearance by transabdominal scan.
Impression

 IUP at 24+5 weeks
 Severe right hydronephrosis (grade IV); left pyelectasis, mild
 (6 mm); no ureters identified; bladder and amniotic fluid
 normal; findings are consistent with right UPJ obstruction
 All other interval fetal anatomy was seen and appeared
 normal
 Normal amniotic fluid volume
 Appropriate interval growth with EFW at the 45th %tile
Recommendations

 [facility]-up ultrasound in [place] at the [facility] in 4-6 weeks to
 reassess kidneys and meet with a pediatric urologist
 Serial ultrasounds for growth to follow normal kidney and
 AFV; otherwise, routine prenatal care and delivery

 questions or concerns.

## 2012-09-24 NOTE — L&D Delivery Note (Signed)
Delivery Note At 8:28 AM a viable female was delivered via Vaginal, Spontaneous Delivery (Presentation: ; Occiput Posterior).  APGAR: 7, 8; weight .   Placenta status: Intact, Spontaneous.  Cord: 3 vessels with the following complications:nuchal cord .  Cord pH: 7.16  Anesthesia: Local  Episiotomy: Median Lacerations: 4th degree Suture Repair: 2.0 chromic vicryl Est. Blood Loss (mL): 400  Mom to postpartum.  Baby to nursery-stable.  Indie Boehne S 12/06/2012, 9:20 AM

## 2012-09-30 ENCOUNTER — Telehealth: Payer: Self-pay

## 2012-09-30 NOTE — Telephone Encounter (Signed)
Pt wanted to know if had tdap. Advised pt immunization record has td given 07/17/2005.the patient voiced understanding.

## 2012-10-31 ENCOUNTER — Other Ambulatory Visit (HOSPITAL_COMMUNITY): Payer: Self-pay | Admitting: Obstetrics and Gynecology

## 2012-10-31 DIAGNOSIS — IMO0002 Reserved for concepts with insufficient information to code with codable children: Secondary | ICD-10-CM

## 2012-11-21 ENCOUNTER — Ambulatory Visit (HOSPITAL_COMMUNITY)
Admission: RE | Admit: 2012-11-21 | Discharge: 2012-11-21 | Disposition: A | Discharge status: Home / Self Care | Payer: BC Managed Care – PPO | Source: Ambulatory Visit | Attending: Obstetrics and Gynecology | Admitting: Obstetrics and Gynecology

## 2012-11-21 VITALS — BP 109/62 | HR 66 | Wt 139.5 lb

## 2012-11-21 DIAGNOSIS — IMO0002 Reserved for concepts with insufficient information to code with codable children: Secondary | ICD-10-CM

## 2012-11-21 DIAGNOSIS — O358XX0 Maternal care for other (suspected) fetal abnormality and damage, not applicable or unspecified: Secondary | ICD-10-CM | POA: Insufficient documentation

## 2012-11-21 NOTE — Progress Notes (Signed)
Susan Hurst  was seen today for an ultrasound appointment.  See full report in [facility].  Comments: Susan Hurst is seen today due to right fetal kidney abnormality.  On ultrasound today, grade IV hydronephrosis is noted on the right with only a small rim of renal tissue noted.  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  Left renal pylectasis is also noted (1 cm) The bladder appears normal.  The amniotic fluid volume is subjectively low normal (AFI of 8 cm).  The fetal heart muscle walls subjectively appear somewhat thickened, but the fetal heart anatomy is normal.  The patient was scheduled for fetal echo with Peds cardiology prior to leaving the office.  Susan Hurst was seen by [facility] at [facility].  Plans delivery at [facility] with evaluation by [facility] after delivery.  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 Low normal amniotic fluid volume Appropriate interval growth with EFW at the 78th %tile.  [facility] measures > 97th %tile, likely due to the right renal hydronephrosis.  Recommendations: Fetal echo scheduled. Recommend weekly limited ultrasound for amniotic fluid assessment Follow up growth scan in 3 weeks- would watch for abdominal dystocia due to enlarged AC.  Alpha Gula, MD

## 2012-11-26 ENCOUNTER — Encounter: Payer: Self-pay | Admitting: Obstetrics and Gynecology

## 2012-11-26 DIAGNOSIS — O359XX0 Maternal care for (suspected) fetal abnormality and damage, unspecified, not applicable or unspecified: Secondary | ICD-10-CM | POA: Insufficient documentation

## 2012-11-28 ENCOUNTER — Ambulatory Visit (HOSPITAL_COMMUNITY)
Admission: RE | Admit: 2012-11-28 | Discharge: 2012-11-28 | Disposition: A | Discharge status: Home / Self Care | Payer: BC Managed Care – PPO | Source: Ambulatory Visit | Attending: Obstetrics and Gynecology | Admitting: Obstetrics and Gynecology

## 2012-11-28 VITALS — BP 115/69 | HR 74 | Wt 141.2 lb

## 2012-11-28 DIAGNOSIS — IMO0002 Reserved for concepts with insufficient information to code with codable children: Secondary | ICD-10-CM

## 2012-11-28 DIAGNOSIS — O358XX0 Maternal care for other (suspected) fetal abnormality and damage, not applicable or unspecified: Secondary | ICD-10-CM | POA: Insufficient documentation

## 2012-12-05 ENCOUNTER — Other Ambulatory Visit (HOSPITAL_COMMUNITY): Payer: Self-pay | Admitting: Maternal and Fetal Medicine

## 2012-12-05 ENCOUNTER — Encounter (HOSPITAL_COMMUNITY): Payer: Self-pay

## 2012-12-05 ENCOUNTER — Ambulatory Visit (HOSPITAL_COMMUNITY)
Admission: RE | Admit: 2012-12-05 | Discharge: 2012-12-05 | Disposition: A | Discharge status: Home / Self Care | Payer: BC Managed Care – PPO | Source: Ambulatory Visit | Attending: Obstetrics and Gynecology | Admitting: Obstetrics and Gynecology

## 2012-12-05 ENCOUNTER — Encounter (HOSPITAL_COMMUNITY): Payer: Self-pay | Admitting: *Deleted

## 2012-12-05 ENCOUNTER — Inpatient Hospital Stay (HOSPITAL_COMMUNITY)
Admission: AD | Admit: 2012-12-05 | Discharge: 2012-12-08 | DRG: 372 | Disposition: A | Discharge status: Home / Self Care | Payer: BC Managed Care – PPO | Source: Ambulatory Visit | Attending: Obstetrics and Gynecology | Admitting: Obstetrics and Gynecology

## 2012-12-05 DIAGNOSIS — Z3403 Encounter for supervision of normal first pregnancy, third trimester: Secondary | ICD-10-CM

## 2012-12-05 DIAGNOSIS — R3129 Other microscopic hematuria: Secondary | ICD-10-CM

## 2012-12-05 DIAGNOSIS — R634 Abnormal weight loss: Secondary | ICD-10-CM

## 2012-12-05 DIAGNOSIS — O4100X0 Oligohydramnios, unspecified trimester, not applicable or unspecified: Principal | ICD-10-CM | POA: Diagnosis present

## 2012-12-05 DIAGNOSIS — O358XX0 Maternal care for other (suspected) fetal abnormality and damage, not applicable or unspecified: Secondary | ICD-10-CM | POA: Diagnosis present

## 2012-12-05 DIAGNOSIS — Z0000 Encounter for general adult medical examination without abnormal findings: Secondary | ICD-10-CM

## 2012-12-05 DIAGNOSIS — IMO0002 Reserved for concepts with insufficient information to code with codable children: Secondary | ICD-10-CM

## 2012-12-05 DIAGNOSIS — E041 Nontoxic single thyroid nodule: Secondary | ICD-10-CM

## 2012-12-05 DIAGNOSIS — Z3201 Encounter for pregnancy test, result positive: Secondary | ICD-10-CM

## 2012-12-05 HISTORY — DX: Unspecified asthma, uncomplicated: J45.909

## 2012-12-05 LAB — CBC
HCT: 36.8 % (ref 36.0–46.0)
Hemoglobin: 12.6 g/dL (ref 12.0–15.0)
MCHC: 34.2 g/dL (ref 30.0–36.0)
RBC: 3.94 MIL/uL (ref 3.87–5.11)
WBC: 9.4 10*3/uL (ref 4.0–10.5)

## 2012-12-05 MED ORDER — LIDOCAINE HCL (PF) 1 % IJ SOLN
30.0000 mL | INTRAMUSCULAR | Status: AC | PRN
  Administered 2012-12-06 (×2): 30 mL via SUBCUTANEOUS
  Filled 2012-12-05 (×2): qty 30

## 2012-12-05 MED ORDER — CITRIC ACID-SODIUM CITRATE 334-500 MG/5ML PO SOLN: 30.0000 mL | ORAL | Status: DC | PRN

## 2012-12-05 MED ORDER — FLUTICASONE PROPIONATE 50 MCG/ACT NA SUSP
2.0000 | Freq: Every day | NASAL | Status: DC
  Filled 2012-12-05: qty 16

## 2012-12-05 MED ORDER — MISOPROSTOL 25 MCG QUARTER TABLET
25.0000 ug | ORAL_TABLET | ORAL | Status: DC | PRN
  Administered 2012-12-05 – 2012-12-06 (×2): 25 ug via VAGINAL
  Filled 2012-12-05 (×2): qty 0.25

## 2012-12-05 MED ORDER — ZOLPIDEM TARTRATE 5 MG PO TABS: 5.0000 mg | ORAL_TABLET | Freq: Every evening | ORAL | Status: DC | PRN

## 2012-12-05 MED ORDER — OXYTOCIN 40 UNITS IN LACTATED RINGERS INFUSION - SIMPLE MED
62.5000 mL/h | INTRAVENOUS | Status: DC
  Administered 2012-12-06: 999 mL/h via INTRAVENOUS
  Administered 2012-12-06: 62.5 mL/h via INTRAVENOUS
  Filled 2012-12-05: qty 1000

## 2012-12-05 MED ORDER — OXYTOCIN BOLUS FROM INFUSION: 500.0000 mL | INTRAVENOUS | Status: DC

## 2012-12-05 MED ORDER — CLINDAMYCIN PHOSPHATE 900 MG/50ML IV SOLN
900.0000 mg | Freq: Three times a day (TID) | INTRAVENOUS | Status: DC
  Administered 2012-12-06: 900 mg via INTRAVENOUS
  Filled 2012-12-05 (×2): qty 50

## 2012-12-05 MED ORDER — OXYCODONE-ACETAMINOPHEN 5-325 MG PO TABS: 1.0000 | ORAL_TABLET | ORAL | Status: DC | PRN

## 2012-12-05 MED ORDER — LACTATED RINGERS IV SOLN: INTRAVENOUS | Status: DC

## 2012-12-05 MED ORDER — TERBUTALINE SULFATE 1 MG/ML IJ SOLN: 0.2500 mg | Freq: Once | INTRAMUSCULAR | Status: AC | PRN

## 2012-12-05 MED ORDER — FLEET ENEMA 7-19 GM/118ML RE ENEM: 1.0000 | ENEMA | RECTAL | Status: DC | PRN

## 2012-12-05 MED ORDER — LACTATED RINGERS IV SOLN: 500.0000 mL | INTRAVENOUS | Status: DC | PRN

## 2012-12-05 MED ORDER — ACETAMINOPHEN 325 MG PO TABS: 650.0000 mg | ORAL_TABLET | ORAL | Status: DC | PRN

## 2012-12-05 MED ORDER — IBUPROFEN 600 MG PO TABS: 600.0000 mg | ORAL_TABLET | Freq: Four times a day (QID) | ORAL | Status: DC | PRN

## 2012-12-05 MED ORDER — ONDANSETRON HCL 4 MG/2ML IJ SOLN: 4.0000 mg | Freq: Four times a day (QID) | INTRAMUSCULAR | Status: DC | PRN

## 2012-12-05 NOTE — Progress Notes (Signed)
Susan Hurst  was seen today for an ultrasound appointment.  See full report in [facility].  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  [facility] today measures at the 54th %tile. BPP 6/8 (-2 for oligohydramnios)  Patient was seen recently by [facility] due to thickened heart wall.  They recommended newborn echo after delivery - come concern for possible coarcation of the Aorta  Recommendation: Recommend induction of labor due to new onset oligohydramnios. Will need renal ultrasound after delivery and follow up with Peds Urology (Dr. Yetta Flock at [facility]) Neonatal echo - rule out coarcation of the Aorta.  Alpha Gula, MD

## 2012-12-05 NOTE — Progress Notes (Signed)
Called MD with results of GBS preliminary. MD orders to start Cleocin in labor.

## 2012-12-05 NOTE — H&P (Signed)
Susan Hurst is a 32 y.o. female presenting at 62.5 for induction.  Fetus with severe hydronephrosis of right kidney and possible coarctation of aorta.  sono with MFM today with oligohydramnios.  Suggested induction.  Maternal Medical History:  Prenatal complications: Severe right hydronephrosis of fetal kidney ptyalectasis of left kidney oligohydramnios  Cardiac thickening possible coarctation of arota  Prenatal Complications - Diabetes: none.    OB History   Grav Para Term Preterm Abortions TAB SAB Ect Mult Living   1              Past Medical History  Diagnosis Date  . Thyroid cyst    No past surgical history on file. Family History: family history includes Hyperlipidemia in her father; Hypertension in her father; Hypothyroidism in her sister; and Polycystic ovary syndrome in her sister. Social History:  reports that she has never smoked. She does not have any smokeless tobacco history on file. She reports that she does not drink alcohol or use illicit drugs.   Prenatal Transfer Tool  Maternal Diabetes: No Genetic Screening: Declined Maternal Ultrasounds/Referrals: Abnormal:  Findings:   Fetal Kidney Anomalies, Cardiac defect Fetal Ultrasounds or other Referrals:  Fetal echo Maternal Substance Abuse:  No Significant Maternal Medications:  None Significant Maternal Lab Results:  None Other Comments:  None  ROS    Last menstrual period 03/23/2012. Maternal Exam:  Abdomen: Patient reports no abdominal tenderness. Fundal height is c/w dates.   Estimated fetal weight is 6.   Fetal presentation: vertex  Pelvis: adequate for delivery.      Physical Exam  Prenatal labs: ABO, Rh:   Antibody:   Rubella:   RPR:    HBsAg:    HIV:    GBS:     Assessment/Plan: IUP at 36.5 with oligo. Fetal right hydronephrosis Fetal cardiac abnormality admitt for cytotec and induction per MFM   [facility] S 12/05/2012, 7:55 PM

## 2012-12-05 NOTE — Progress Notes (Signed)
Called to get update on pt. Orders already in computer except for PCN treatment if not negative.  Cleocin order given. Pt may have Ambien

## 2012-12-06 ENCOUNTER — Encounter (HOSPITAL_COMMUNITY): Payer: Self-pay | Admitting: *Deleted

## 2012-12-06 LAB — RPR: RPR Ser Ql: NONREACTIVE

## 2012-12-06 MED ORDER — OXYCODONE-ACETAMINOPHEN 5-325 MG PO TABS: 1.0000 | ORAL_TABLET | ORAL | Status: DC | PRN

## 2012-12-06 MED ORDER — DIPHENHYDRAMINE HCL 25 MG PO CAPS: 25.0000 mg | ORAL_CAPSULE | Freq: Four times a day (QID) | ORAL | Status: DC | PRN

## 2012-12-06 MED ORDER — SIMETHICONE 80 MG PO CHEW: 80.0000 mg | CHEWABLE_TABLET | ORAL | Status: DC | PRN

## 2012-12-06 MED ORDER — PRENATAL MULTIVITAMIN CH
1.0000 | ORAL_TABLET | Freq: Every day | ORAL | Status: DC
  Administered 2012-12-06 – 2012-12-08 (×3): 1 via ORAL
  Filled 2012-12-06 (×3): qty 1

## 2012-12-06 MED ORDER — ZOLPIDEM TARTRATE 5 MG PO TABS: 5.0000 mg | ORAL_TABLET | Freq: Every evening | ORAL | Status: DC | PRN

## 2012-12-06 MED ORDER — ONDANSETRON HCL 4 MG/2ML IJ SOLN: 4.0000 mg | INTRAMUSCULAR | Status: DC | PRN

## 2012-12-06 MED ORDER — DOCUSATE SODIUM 100 MG PO CAPS
100.0000 mg | ORAL_CAPSULE | Freq: Two times a day (BID) | ORAL | Status: DC
  Administered 2012-12-06 – 2012-12-08 (×5): 100 mg via ORAL
  Filled 2012-12-06 (×6): qty 1

## 2012-12-06 MED ORDER — LANOLIN HYDROUS EX OINT: TOPICAL_OINTMENT | CUTANEOUS | Status: DC | PRN

## 2012-12-06 MED ORDER — ONDANSETRON HCL 4 MG PO TABS: 4.0000 mg | ORAL_TABLET | ORAL | Status: DC | PRN

## 2012-12-06 MED ORDER — WITCH HAZEL-GLYCERIN EX PADS: 1.0000 "application " | MEDICATED_PAD | CUTANEOUS | Status: DC | PRN

## 2012-12-06 MED ORDER — SENNOSIDES-DOCUSATE SODIUM 8.6-50 MG PO TABS
2.0000 | ORAL_TABLET | Freq: Every day | ORAL | Status: DC
  Administered 2012-12-06 – 2012-12-07 (×2): 2 via ORAL

## 2012-12-06 MED ORDER — IBUPROFEN 600 MG PO TABS
600.0000 mg | ORAL_TABLET | Freq: Four times a day (QID) | ORAL | Status: DC
  Administered 2012-12-06 – 2012-12-08 (×10): 600 mg via ORAL
  Filled 2012-12-06 (×10): qty 1

## 2012-12-06 MED ORDER — DIBUCAINE 1 % RE OINT: 1.0000 "application " | TOPICAL_OINTMENT | RECTAL | Status: DC | PRN

## 2012-12-06 MED ORDER — TETANUS-DIPHTH-ACELL PERTUSSIS 5-2.5-18.5 LF-MCG/0.5 IM SUSP
0.5000 mL | Freq: Once | INTRAMUSCULAR | Status: DC
  Filled 2012-12-06: qty 0.5

## 2012-12-06 MED ORDER — BENZOCAINE-MENTHOL 20-0.5 % EX AERO
1.0000 "application " | INHALATION_SPRAY | CUTANEOUS | Status: DC | PRN
  Administered 2012-12-06 – 2012-12-08 (×2): 1 via TOPICAL
  Filled 2012-12-06 (×2): qty 56

## 2012-12-06 NOTE — Progress Notes (Signed)
Patient ID: Susan Hurst, female   DOB: 1981/05/23, 32 y.o.   MRN: [id number] Additional delivery note.  Because of worsening decelerations. We made a second degree midline episiotomy under local. The infant delivered occiput posterior. There was a fourth degree extension of the episiotomy. Initially we did not notice the rectal extension. So we had to take down part of the repair. Subsequently identified the apex of the mucosal tear into the rectum. This was closed with interrupted sutures of 3-0 chromic. Perirectal tissue was brought over this with interrupted sutures of 2-0 chromic. The rectus sphincter muscle was repaired. We reapproximated the fascia with figure-of-eight of 2-0 Vicryl. The remaining second-degree episiotomy was repaired with 2-0 chromic. Rectal exam at the end of the procedure revealed the rectum to be intact at this point in time.

## 2012-12-06 NOTE — Progress Notes (Signed)
Patient ID: Susan Hurst, female   DOB: [birth year], 32 y.o.   MRN: [id number] Received two Cytotec. Srom at 6:40 clear. Onset of variable to late decel at 7:30 Oxygen applied and position change.  Called at 8 am.  Arrived with delivery at 8:28.

## 2012-12-07 ENCOUNTER — Encounter (HOSPITAL_COMMUNITY): Payer: Self-pay | Admitting: *Deleted

## 2012-12-07 LAB — CBC
HCT: 31.8 % — ABNORMAL LOW (ref 36.0–46.0)
Platelets: 126 10*3/uL — ABNORMAL LOW (ref 150–400)
RBC: 3.38 MIL/uL — ABNORMAL LOW (ref 3.87–5.11)
RDW: 12.7 % (ref 11.5–15.5)
WBC: 13.7 10*3/uL — ABNORMAL HIGH (ref 4.0–10.5)

## 2012-12-07 NOTE — Progress Notes (Signed)
Post Partum Day one Subjective: voiding, tolerating PO and sono fo baby with right hydronephrosis probable ureteral bladder juntion obstruction  Objective: Blood pressure 94/57, pulse 63, temperature 97.6 F (36.4 C), temperature source Oral, resp. rate 18, height 5\' 9"  (1.753 m), weight 63.957 kg (141 lb), last menstrual period 03/23/2012, SpO2 99.00%, unknown if currently breastfeeding.  Physical Exam:  General: alert Lochia: appropriate Uterine Fundus: firm Incision: healing well DVT Evaluation: No evidence of DVT seen on physical exam.   Recent Labs  12/05/12 2027 12/07/12 0615  HGB 12.6 10.7*  HCT 36.8 31.8*    Assessment/Plan: Plan for discharge tomorrow   LOS: 2 days   Susan Hurst S 12/07/2012, 9:36 AM

## 2012-12-07 NOTE — Progress Notes (Signed)
Dark bluish gray irregular shaped bruise on right inner peri-anal area, approximately covering a 8 cms. X 6 cms area.

## 2012-12-08 MED ORDER — DSS 100 MG PO CAPS: 100.0000 mg | ORAL_CAPSULE | Freq: Two times a day (BID) | ORAL | Status: DC

## 2012-12-08 MED ORDER — IBUPROFEN 600 MG PO TABS: 600.0000 mg | ORAL_TABLET | Freq: Four times a day (QID) | ORAL | Status: DC

## 2012-12-08 NOTE — Progress Notes (Signed)
UR chart review completed.  

## 2012-12-08 NOTE — Discharge Summary (Signed)
Obstetric Discharge Summary Reason for Admission: induction of labor Prenatal Procedures: ultrasound Intrapartum Procedures: spontaneous vaginal delivery Postpartum Procedures: none Complications-Operative and Postpartum: 4 degree perineal laceration Hemoglobin  Date Value Range Status  12/07/2012 10.7* 12.0 - 15.0 g/dL Final     HCT  Date Value Range Status  12/07/2012 31.8* 36.0 - 46.0 % Final    Physical Exam:  General: alert and cooperative Lochia: appropriate Uterine Fundus: firm Incision: perineum intact. Ecchymosis noted, soft DVT Evaluation: No evidence of DVT seen on physical exam. No significant calf/ankle edema.  Discharge Diagnoses: Term Pregnancy-delivered  Discharge Information: Date: 12/08/2012 Activity: pelvic rest Diet: routine Medications: PNV, Ibuprofen and Colace Condition: improved Instructions: refer to practice specific booklet Discharge to: home   Newborn Data: Live born female  Birth Weight: 5 lb 11.8 oz (2603 g) APGAR: 7, 8  Home with mother, unless held this am secondary to bilirubin.  CURTIS,CAROL G 12/08/2012, 8:17 AM

## 2012-12-08 NOTE — Lactation Note (Signed)
This note was copied from the chart of Susan Hurst. Lactation Consultation Note  Patient Name: Susan Linna Thebeau [id number] Date: 12/08/2012 Reason for consult: Follow-up assessment   Maternal Data    Feeding Feeding Type: Breast Milk Feeding method: Breast Length of feed: 5 min  LATCH Score/Interventions Latch: Grasps breast easily, tongue down, lips flanged, rhythmical sucking.  Audible Swallowing: A few with stimulation  Type of Nipple: Everted at rest and after stimulation  Comfort (Breast/Nipple): Filling, red/small blisters or bruises, mild/mod discomfort  Problem noted: Mild/Moderate discomfort  Hold (Positioning): No assistance needed to correctly position infant at breast.  LATCH Score: 8  Lactation Tools Discussed/Used     Consult Status Consult Status: Complete  Called by Mom to observe latch. Baby fussy when she came back to room. Took a few sucks and then off to sleep. With some stimulation continued nursing for about 5 minutes. Mom doing well with positioning baby at the breast. No questions at present,. To call prn  Pamelia Hoit 12/08/2012, 10:27 AM

## 2012-12-11 ENCOUNTER — Ambulatory Visit (HOSPITAL_COMMUNITY): Payer: BC Managed Care – PPO

## 2012-12-12 ENCOUNTER — Inpatient Hospital Stay (HOSPITAL_COMMUNITY): Admission: RE | Admit: 2012-12-12 | Payer: BC Managed Care – PPO | Source: Ambulatory Visit

## 2012-12-19 ENCOUNTER — Ambulatory Visit (HOSPITAL_COMMUNITY): Payer: BC Managed Care – PPO

## 2014-07-26 ENCOUNTER — Encounter (HOSPITAL_COMMUNITY): Payer: Self-pay | Admitting: *Deleted

## 2015-02-22 ENCOUNTER — Other Ambulatory Visit: Payer: Self-pay | Admitting: Obstetrics & Gynecology

## 2015-02-23 LAB — CYTOLOGY - PAP

## 2015-05-11 ENCOUNTER — Ambulatory Visit (INDEPENDENT_AMBULATORY_CARE_PROVIDER_SITE_OTHER): Payer: BLUE CROSS/BLUE SHIELD | Admitting: Family Medicine

## 2015-05-11 ENCOUNTER — Encounter: Payer: Self-pay | Admitting: Family Medicine

## 2015-05-11 VITALS — BP 116/74 | HR 73 | Temp 98.2°F | Wt 123.2 lb

## 2015-05-11 DIAGNOSIS — R350 Frequency of micturition: Secondary | ICD-10-CM | POA: Diagnosis not present

## 2015-05-11 LAB — POCT URINALYSIS DIPSTICK
BILIRUBIN UA: NEGATIVE
Glucose, UA: NEGATIVE
Ketones, UA: NEGATIVE
Leukocytes, UA: NEGATIVE
NITRITE UA: NEGATIVE
PH UA: 6
UROBILINOGEN UA: 0.2

## 2015-05-11 MED ORDER — CIPROFLOXACIN HCL 500 MG PO TABS: 500.0000 mg | ORAL_TABLET | Freq: Two times a day (BID) | ORAL | Status: DC

## 2015-05-11 NOTE — Progress Notes (Signed)
SUBJECTIVE: Susan Hurst is a 34 y.o. female who complains of urinary frequency, urgency and dysuria x 5 days, without fever, chills, or abnormal vaginal discharge or bleeding.  Last night developed right flank pain. No current outpatient prescriptions on file prior to visit.   No current facility-administered medications on file prior to visit.    Allergies  Allergen Reactions  . Penicillins Hives and Rash    Past Medical History  Diagnosis Date  . Thyroid cyst   . Asthma     hx of- no meds in years    Past Surgical History  Procedure Laterality Date  . Wisdom tooth extraction  2001    Family History  Problem Relation Age of Onset  . Hyperlipidemia Father   . Hypertension Father   . Polycystic ovary syndrome Sister   . Hypothyroidism Sister     Social History   Social History  . Marital Status: Married    Spouse Name: N/A  . Number of Children: N/A  . 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 Smoking status: Never Smoker   . Smokeless tobacco: Not on file  . Alcohol Use: No  . Drug Use: No  . Sexual Activity: Not on file   Other Topics Concern  . Not on file   Social History Narrative   The PMH, PSH, Social History, Family History, Medications, and allergies have been reviewed in [facility], and have been updated if relevant.  OBJECTIVE:  BP 116/74 mmHg  Pulse 73  Temp(Src) 98.2 F (36.8 C) (Oral)  Wt 123 lb 4 oz (55.906 kg)  SpO2 98%  LMP 04/27/2015  Breastfeeding? No  Appears well, in no apparent distress.  Vital signs are normal. The abdomen is soft without tenderness, guarding, mass, rebound or organomegaly. No CVA tenderness or inguinal adenopathy noted. Urine dipstick shows positive for RBC's and positive for protein.   ASSESSMENT: UTI uncomplicated without evidence of pyelonephritis  PLAN: Treatment per orders - cipro 500 mg twice daily x 5 days, send urine for cx, also push fluids,  may use Pyridium OTC prn. Call or return to clinic prn if these symptoms worsen or fail to improve as anticipated.

## 2015-05-11 NOTE — Addendum Note (Signed)
Addended by: Desmond Dike on: 05/11/2015 09:29 AM   Modules accepted: Orders

## 2015-05-11 NOTE — Progress Notes (Signed)
Pre visit review using our clinic review tool, if applicable. No additional management support is needed unless otherwise documented below in the visit note. 

## 2015-05-13 LAB — URINE CULTURE
COLONY COUNT: NO GROWTH
ORGANISM ID, BACTERIA: NO GROWTH

## 2015-05-26 ENCOUNTER — Ambulatory Visit (INDEPENDENT_AMBULATORY_CARE_PROVIDER_SITE_OTHER): Payer: BLUE CROSS/BLUE SHIELD | Admitting: Family Medicine

## 2015-05-26 ENCOUNTER — Encounter: Payer: Self-pay | Admitting: Family Medicine

## 2015-05-26 VITALS — BP 112/70 | HR 62 | Temp 98.0°F | Ht 68.0 in | Wt 122.2 lb

## 2015-05-26 DIAGNOSIS — R300 Dysuria: Secondary | ICD-10-CM | POA: Diagnosis not present

## 2015-05-26 NOTE — Progress Notes (Signed)
   Subjective:   Patient ID: Susan Hurst, female    DOB: 1981-01-01, 34 y.o.   MRN: [id number]  Verbie Babic is a pleasant 34 y.o. year old female who presents to clinic today with Establish Care  on 05/26/2015  HPI:  Saw her two weeks ago for urinary frequency.  Was told to make an establish care appointment since I had not seen her in over 3 years.  Urine cx neg and symptoms resolved without abx.  Has no complaints today and declines CPX since she sees OB regularly.  No current outpatient prescriptions on file prior to visit.   No current facility-administered medications on file prior to visit.    Allergies  Allergen Reactions  . Penicillins Hives and Rash    Past Medical History  Diagnosis Date  . Thyroid cyst   . Asthma     hx of- no meds in years    Past Surgical History  Procedure Laterality Date  . Wisdom tooth extraction  2001    Family History  Problem Relation Age of Onset  . Hyperlipidemia Father   . Hypertension Father   . Polycystic ovary syndrome Sister   . Hypothyroidism Sister     Social History   Social History  . Marital Status: Married    Spouse Name: N/A  . Number of Children: N/A  . 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 Smoking status: Never Smoker   . Smokeless tobacco: Not on file  . Alcohol Use: No  . Drug Use: No  . Sexual Activity: Not on file   Other Topics Concern  . Not on file   Social History Narrative   The PMH, PSH, Social History, Family History, Medications, and allergies have been reviewed in [facility], and have been updated if relevant.     Review of Systems  Constitutional: Negative.   HENT: Negative.   Respiratory: Negative.   Cardiovascular: Negative.   Genitourinary: Negative.   Musculoskeletal: Negative.   Psychiatric/Behavioral: Negative.   All other systems reviewed and are negative.      Objective:    BP 112/70 mmHg  Pulse 62   Temp(Src) 98 F (36.7 C) (Oral)  Ht  (1.727 m)  Wt 122 lb 4 oz (55.452 kg)  BMI 18.59 kg/m2  SpO2 98%  LMP 05/25/2015   Physical Exam  Constitutional: She is oriented to person, place, and time. She appears well-developed and well-nourished. No distress.  HENT:  Head: Normocephalic.  Eyes: Conjunctivae are normal.  Cardiovascular: Normal rate.   Pulmonary/Chest: Effort normal.  Musculoskeletal: Normal range of motion.  Neurological: She is alert and oriented to person, place, and time. No cranial nerve deficit.  Skin: Skin is warm and dry.  Psychiatric: She has a normal mood and affect. Her behavior is normal. Thought content normal.  Nursing note and vitals reviewed.         Assessment & Plan:   Dysuria No Follow-up on file.

## 2015-05-26 NOTE — Progress Notes (Signed)
Pre visit review using our clinic review tool, if applicable. No additional management support is needed unless otherwise documented below in the visit note. 

## 2015-05-26 NOTE — Assessment & Plan Note (Signed)
Resolved. No further tx or work up needed.

## 2015-06-22 ENCOUNTER — Ambulatory Visit (INDEPENDENT_AMBULATORY_CARE_PROVIDER_SITE_OTHER): Payer: BLUE CROSS/BLUE SHIELD | Admitting: Primary Care

## 2015-06-22 ENCOUNTER — Encounter (INDEPENDENT_AMBULATORY_CARE_PROVIDER_SITE_OTHER): Payer: Self-pay

## 2015-06-22 ENCOUNTER — Encounter: Payer: Self-pay | Admitting: Primary Care

## 2015-06-22 VITALS — BP 108/62 | HR 80 | Temp 98.4°F | Wt 122.2 lb

## 2015-06-22 DIAGNOSIS — J3489 Other specified disorders of nose and nasal sinuses: Secondary | ICD-10-CM

## 2015-06-22 DIAGNOSIS — R0981 Nasal congestion: Secondary | ICD-10-CM

## 2015-06-22 NOTE — Patient Instructions (Signed)
Try taking an antihistamine such as Claritin, Zyrtec, or Allegra.  Use Flonase for nasal congestion. Instill 2 squirts into each nostril once daily.  Increase intake of water. You may take tylenol for any fevers or body aches if they develop.  If your cough becomes worse, you may take Delsym or Robitussin over the counter.  Please call me if no improvement on Monday next week.  It was a pleasure to see you today!

## 2015-06-22 NOTE — Progress Notes (Signed)
   Subjective:    Patient ID: Susan Hurst, female    DOB: 1980-10-08, 34 y.o.   MRN: [id number]  HPI  Susan Hurst is a 34 year old female who presents today with a chief complaint of sinus pressure. Her symptoms also consists of nasal congestion, sore throat, rhinorrhea, weakness, and minor cough. Her symptoms began Sunday this week and this morning started feeling worse with weakness and fatigue. Denies fevers, chills, body aches. She has not taken any medication OTC.  Review of Systems  Constitutional: Negative for fever and chills.  HENT: Positive for congestion, rhinorrhea, sinus pressure and sore throat. Negative for ear pain.        Fullness to ears  Respiratory: Positive for cough. Negative for shortness of breath.   Cardiovascular: Negative for chest pain.  Musculoskeletal: Negative for myalgias.  Neurological: Positive for weakness.       Past Medical History  Diagnosis Date  . Thyroid cyst   . Asthma     hx of- no meds in years    Social History   Social History  . Marital Status: Married    Spouse Name: N/A  . Number of Children: N/A  . 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 Smoking status: Never Smoker   . Smokeless tobacco: Not on file  . Alcohol Use: No  . Drug Use: No  . Sexual Activity: Not on file   Other Topics Concern  . Not on file   Social History Narrative    Past Surgical History  Procedure Laterality Date  . Wisdom tooth extraction  2001    Family History  Problem Relation Age of Onset  . Hyperlipidemia Father   . Hypertension Father   . Polycystic ovary syndrome Sister   . Hypothyroidism Sister     Allergies  Allergen Reactions  . Penicillins Hives and Rash    No current outpatient prescriptions on file prior to visit.   No current facility-administered medications on file prior to visit.    BP 108/62 mmHg  Pulse 80  Temp(Src) 98.4 F (36.9 C) (Oral)   Wt 122 lb 4 oz (55.452 kg)  LMP 05/25/2015    Objective:   Physical Exam  Constitutional: She appears well-nourished.  HENT:  Right Ear: Tympanic membrane and ear canal normal.  Left Ear: Tympanic membrane and ear canal normal.  Nose: Right sinus exhibits no maxillary sinus tenderness and no frontal sinus tenderness. Left sinus exhibits no maxillary sinus tenderness and no frontal sinus tenderness.  Mouth/Throat: Posterior oropharyngeal erythema present. No oropharyngeal exudate or posterior oropharyngeal edema.  Eyes: Conjunctivae are normal. Pupils are equal, round, and reactive to light.  Neck: Neck supple.  Cardiovascular: Normal rate and regular rhythm.   Pulmonary/Chest: Effort normal and breath sounds normal.  Lymphadenopathy:    She has no cervical adenopathy.  Skin: Skin is warm and dry.          Assessment & Plan:  Common Cold/URI:  Weakness, cough, postnasal drip, nasal congestion gradual since Sunday this week. She's not taken anything OTC. Suspect viral vs allergy involvement. Exam mostly unremarkable. Lungs clear. Treat with supportive measures. Claritin, Flonase, Delsym, PRN. She is to follow up if no improvement by Monday next week.

## 2015-06-22 NOTE — Progress Notes (Signed)
Pre visit review using our clinic review tool, if applicable. No additional management support is needed unless otherwise documented below in the visit note. 

## 2015-09-25 NOTE — L&D Delivery Note (Signed)
Delivery Summary for Susan FantasiaKristin Correia  Labor Events:   Preterm labor:   Rupture date:   Rupture time:   Rupture type:   Fluid Color:   Induction:   Augmentation:   Complications:   Cervical ripening:          Delivery:   Episiotomy:   Lacerations:   Repair suture:   Repair # of packets:   Blood loss (ml): 300   Information for the patient's newborn:  Olevia BowensFleming, Girl Greidy [[id number]][[id number]]    Delivery 06/15/2016 9:24 AM by  Vaginal, Spontaneous Delivery Sex:  female Gestational Age: 5675w0d Delivery Clinician:   Living?:         APGARS  One minute Five minutes Ten minutes  Skin color:        Heart rate:        Grimace:        Muscle tone:        Breathing:        Totals: 8  9      Presentation/position:      Resuscitation:   Cord information:    Disposition of cord blood:     Blood gases sent?  Complications:   Placenta: Delivered:       appearance Newborn Measurements: Weight: 6 lb 4.2 oz (2840 g)  Height: 18.7"  Head circumference:    Chest circumference:    Other providers:    Additional  information: Forceps:   Vacuum:   Breech:   Observed anomalies      Delivery Note At 9:24 AM a viable and healthy female was delivered via Vaginal, Spontaneous Delivery (Presentation: vertex; LOA position).  APGAR: 8, 9; weight 2840 grams.   Placenta status: spontaneously removed, intact.  Cord: 3-vessel, with the following complications: none.  Cord pH: not obtained.  Anesthesia:  None Episiotomy: None Lacerations: 2nd degree;Perineal Suture Repair: 2.0 vicryl Est. Blood Loss (mL): 200  Mom to postpartum.  Baby to Couplet care / Skin to Skin.  Hildred Lasernika Cricket Goodlin 06/15/2016, 10:41 AM

## 2015-11-08 ENCOUNTER — Ambulatory Visit (INDEPENDENT_AMBULATORY_CARE_PROVIDER_SITE_OTHER): Payer: BLUE CROSS/BLUE SHIELD | Admitting: Obstetrics and Gynecology

## 2015-11-08 ENCOUNTER — Encounter: Payer: Self-pay | Admitting: Obstetrics and Gynecology

## 2015-11-08 VITALS — BP 106/69 | HR 65 | Ht 68.0 in | Wt 122.1 lb

## 2015-11-08 DIAGNOSIS — N926 Irregular menstruation, unspecified: Secondary | ICD-10-CM

## 2015-11-08 DIAGNOSIS — Z36 Encounter for antenatal screening of mother: Secondary | ICD-10-CM | POA: Diagnosis not present

## 2015-11-08 DIAGNOSIS — Z3490 Encounter for supervision of normal pregnancy, unspecified, unspecified trimester: Secondary | ICD-10-CM

## 2015-11-08 DIAGNOSIS — O359XX0 Maternal care for (suspected) fetal abnormality and damage, unspecified, not applicable or unspecified: Secondary | ICD-10-CM

## 2015-11-08 DIAGNOSIS — Z3201 Encounter for pregnancy test, result positive: Secondary | ICD-10-CM | POA: Diagnosis not present

## 2015-11-08 DIAGNOSIS — Z3687 Encounter for antenatal screening for uncertain dates: Secondary | ICD-10-CM

## 2015-11-08 LAB — POCT URINE PREGNANCY: Preg Test, Ur: POSITIVE — AB

## 2015-11-08 NOTE — Patient Instructions (Signed)
1.  Continue prenatal vitamins. 2.  Ultrasound will be scheduled within the next 1-2 weeks for establishing EDD and viability due to history of long cycle intervals 3.  Return for new OB nursing intake interview at [redacted] weeks gestation

## 2015-11-08 NOTE — Progress Notes (Signed)
Chief complaint: 1.  Pregnancy confirmation.  The patient is a 35 year old married white female, gravida 3, para 0111, last menstrual period 09/23/2015, EDD 06/29/2016, EGA 6.[redacted] weeks gestation, presents for pregnancy confirmation.  Cycle intervals-5-1/2 weeks  Patient reports no significant nausea and vomiting.  She does experience breast tenderness.  The patient was taking prenatal vitamins at the time of conception.  Past obstetric history: G1-spontaneous vaginal delivery following induction at 36.2 weeks for oligohydramnios; female infant had fetal hydronephrosis with subsequent loss of one kidney. G2-SAB not requiring D&C  Past Medical History  Diagnosis Date  . Asthma     hx of- no meds in years  . Thyroid cyst    Past Surgical History  Procedure Laterality Date  . Wisdom tooth extraction  2001   Family history: Mom-no genetic disorders; maternal grandmother with history of hydronephrosis diagnosed as adult.  Etiology unknown. FOB-no genetic disorders. Patient not interested in optional genetic testing at this time  Social history: Nonsmoker No drug use history. Social alcohol drinker  Review of systems: Per HPI.  OBJECTIVE: BP 106/69 mmHg  Pulse 65  Ht  (1.727 m)  Wt 122 lb 1.6 oz (55.384 kg)  BMI 18.57 kg/m2  LMP 09/23/2015 UPT-Positive Physical exam-deferred  ASSESSMENT: 1.  Pregnancy. 2.  Irregular menstrual cycles.  (5-1/2 week intervals). 3.  History of asthma, stable, no recent need for emergency room evaluation or steroid use; no chronic medications. 4.  History of thyroid cyst; not on medication.  PLAN: 1.  Ultrasound to confirm fetal viability, and EDD. 2.  Continue prenatal vitamins. 3.  New OB counseling: The patient has been given an overview regarding routine prenatal care.  Recommendations regarding diet, weight gain, and exercise in pregnancy were given.  Prenatal testing, optional genetic testing, and ultrasound use in pregnancy were  reviewed.   Benefits of Breast Feeding were discussed. The patient is encouraged to consider nursing her baby post partum.  A total of 30 minutes were spent face-to-face with the patient during the encounter with greater than 50% dealing with counseling and coordination of care.  Herold Harms, MD  Note: This dictation was prepared with Dragon dictation along with smaller phrase technology. Any transcriptional errors that result from this process are unintentional.

## 2015-11-15 ENCOUNTER — Other Ambulatory Visit: Payer: Self-pay | Admitting: Obstetrics and Gynecology

## 2015-11-15 ENCOUNTER — Ambulatory Visit (INDEPENDENT_AMBULATORY_CARE_PROVIDER_SITE_OTHER): Payer: BLUE CROSS/BLUE SHIELD

## 2015-11-15 DIAGNOSIS — Z3687 Encounter for antenatal screening for uncertain dates: Secondary | ICD-10-CM

## 2015-11-15 DIAGNOSIS — Z36 Encounter for antenatal screening of mother: Secondary | ICD-10-CM | POA: Diagnosis not present

## 2015-11-22 ENCOUNTER — Ambulatory Visit (INDEPENDENT_AMBULATORY_CARE_PROVIDER_SITE_OTHER): Payer: BLUE CROSS/BLUE SHIELD | Admitting: Obstetrics and Gynecology

## 2015-11-22 VITALS — BP 100/46 | HR 62 | Wt 123.1 lb

## 2015-11-22 DIAGNOSIS — Z1389 Encounter for screening for other disorder: Secondary | ICD-10-CM

## 2015-11-22 DIAGNOSIS — Z331 Pregnant state, incidental: Secondary | ICD-10-CM

## 2015-11-22 DIAGNOSIS — Z369 Encounter for antenatal screening, unspecified: Secondary | ICD-10-CM

## 2015-11-22 DIAGNOSIS — Z3490 Encounter for supervision of normal pregnancy, unspecified, unspecified trimester: Secondary | ICD-10-CM

## 2015-11-22 DIAGNOSIS — Z36 Encounter for antenatal screening of mother: Secondary | ICD-10-CM

## 2015-11-22 DIAGNOSIS — Z113 Encounter for screening for infections with a predominantly sexual mode of transmission: Secondary | ICD-10-CM

## 2015-11-22 NOTE — Progress Notes (Signed)
  Susan Hurst presents for NOB nurse interview visit. G-3.  [id number].  Pregnancy education material explained and given.  Cat in the home but pt does not change the litter box. NOB labs ordered.  HIV labs and Drug screen were explained optional and she could opt out of tests but did not decline. Drug screen ordered. PNV encouraged. NT to discuss with provider. Pt. To follow up with provider in 4 weeks for NOB physical.  All questions answered.  ZIKA EXPOSURE SCREEN  The patient has not traveled to a [place] Virus endemic area within the past 6 months, nor has she had unprotected sex with a partner who has travelled to a [place] endemic region within the past 6 months. The patient has been advised to notify us if these factors change any time during this current pregnancy, so adequate testing and monitoring can be initiated.

## 2015-11-22 NOTE — Patient Instructions (Signed)
Pregnancy and Zika Virus Disease Zika virus disease, or Zika, is an illness that can spread to people from mosquitoes that carry the virus. It may also spread from person to person through infected body fluids. Zika first occurred in [place], but recently it has spread to new areas. The virus occurs in tropical climates. The location of Zika continues to change. Most people who become infected with Zika virus do not develop serious illness. However, Zika may cause birth defects in an unborn baby whose mother is infected with the virus. It may also increase the risk of miscarriage. WHAT ARE THE SYMPTOMS OF ZIKA VIRUS DISEASE? In many cases, people who have been infected with Zika virus do not develop any symptoms. If symptoms appear, they usually start about a week after the person is infected. Symptoms are usually mild. They may include:  Fever.  Rash.  Red eyes.  Joint pain. HOW DOES ZIKA VIRUS DISEASE SPREAD? The main way that Zika virus spreads is through the bite of a certain type of mosquito. Unlike most types of mosquitos, which bite only at night, the type of mosquito that carries Zika virus bites both at night and during the day. Zika virus can also spread through sexual contact, through a blood transfusion, and from a mother to her baby before or during birth. Once you have had Zika virus disease, it is unlikely that you will get it again. CAN I PASS ZIKA TO MY BABY DURING PREGNANCY? Yes, Zika can pass from a mother to her baby before or during birth. WHAT PROBLEMS CAN ZIKA CAUSE FOR MY BABY? A woman who is infected with Zika virus while pregnant is at risk of having her baby born with a condition in which the brain or head is smaller than expected (microcephaly). Babies who have microcephaly can have developmental delays, seizures, hearing problems, and vision problems. Having Zika virus disease during pregnancy can also increase the risk of miscarriage. HOW CAN ZIKA VIRUS DISEASE BE  PREVENTED? There is no vaccine to prevent Zika. The best way to prevent the disease is to avoid infected mosquitoes and avoid exposure to body fluids that can spread the virus. Avoid any possible exposure to Zika by taking the following precautions. For women and their sex partners:  Avoid traveling to high-risk areas. The locations where Zika is being reported change often. To identify high-risk areas, check the CDC travel website: www.cdc.gov/zika/geo/index.html  If you or your sex partner must travel to a high-risk area, talk with a health care provider before and after traveling.  Take all precautions to avoid mosquito bites if you live in, or travel to, any of the high-risk areas. Insect repellents are safe to use during pregnancy.  Ask your health care provider when it is safe to have sexual contact. For women:  If you are pregnant or trying to become pregnant, avoid sexual contact with persons who may have been exposed to Zika virus, persons who have possible symptoms of Zika, or persons whose history you are unsure about. If you choose to have sexual contact with someone who may have been exposed to Zika virus, use condoms correctly during the entire duration of sexual activity, every time. Do not share sexual devices, as you may be exposed to body fluids.  Ask your health care provider about when it is safe to attempt pregnancy after a possible exposure to Zika virus. WHAT STEPS SHOULD I TAKE TO AVOID MOSQUITO BITES? Take these steps to avoid mosquito bites when you are   in a high-risk area:  Wear loose clothing that covers your arms and legs.  Limit your outdoor activities.  Do not open windows unless they have window screens.  Sleep under mosquito nets.  Use insect repellent. The best insect repellents have:  DEET, picaridin, oil of lemon eucalyptus (OLE), or IR3535 in them.  Higher amounts of an active ingredient in them.  Remember that insect repellents are safe to use  during pregnancy.  Do not use OLE on children who are younger than 3 years of age. Do not use insect repellent on babies who are younger than 2 months of age.  Cover your child's stroller with mosquito netting. Make sure the netting fits snugly and that any loose netting does not cover your child's mouth or nose. Do not use a blanket as a mosquito-protection cover.  Do not apply insect repellent underneath clothing.  If you are using sunscreen, apply the sunscreen before applying the insect repellent.  Treat clothing with permethrin. Do not apply permethrin directly to your skin. Follow label directions for safe use.  Get rid of standing water, where mosquitoes may reproduce. Standing water is often found in items such as buckets, bowls, animal food dishes, and flowerpots. When you return from traveling to any high-risk area, continue taking actions to protect yourself against mosquito bites for 3 weeks, even if you show no signs of illness. This will prevent spreading Zika virus to uninfected mosquitoes. WHAT SHOULD I KNOW ABOUT THE SEXUAL TRANSMISSION OF ZIKA? People can spread Zika to their sexual partners during vaginal, anal, or oral sex, or by sharing sexual devices. Many people with Zika do not develop symptoms, so a person could spread the disease without knowing that they are infected. The greatest risk is to women who are pregnant or who may become pregnant. Zika virus can live longer in semen than it can live in blood. Couples can prevent sexual transmission of the virus by:  Using condoms correctly during the entire duration of sexual activity, every time. This includes vaginal, anal, and oral sex.  Not sharing sexual devices. Sharing increases your risk of being exposed to body fluid from another person.  Avoiding all sexual activity until your health care provider says it is safe. SHOULD I BE TESTED FOR ZIKA VIRUS? A sample of your blood can be tested for Zika virus. A pregnant  woman should be tested if she may have been exposed to the virus or if she has symptoms of Zika. She may also have additional tests done during her pregnancy, such ultrasound testing. Talk with your health care provider about which tests are recommended.   This information is not intended to replace advice given to you by your health care provider. Make sure you discuss any questions you have with your health care provider.   Document Released: 06/01/2015 Document Reviewed: 05/25/2015 Elsevier Interactive Patient Education 2016 [employer] Minor Illnesses and Medications in Pregnancy  Cold/Flu:  Sudafed for congestion- Robitussin (plain) for cough- Tylenol for discomfort.  Please follow the directions on the label.  Try not to take any more than needed.  OTC Saline nasal spray and air humidifier or cool-mist  Vaporizer to sooth nasal irritation and to loosen congestion.  It is also important to increase intake of non carbonated fluids, especially if you have a fever.  Constipation:  Colace-2 capsules at bedtime; Metamucil- follow directions on label; Senokot- 1 tablet at bedtime.  Any one of these medications can be used.  It is also   very important to increase fluids and fruits along with regular exercise.  If problem persists please call the office.  Diarrhea:  Kaopectate as directed on the label.  Eat a bland diet and increase fluids.  Avoid highly seasoned foods.  Headache:  Tylenol 1 or 2 tablets every 3-4 hours as needed  Indigestion:  Maalox, Mylanta, Tums or Rolaids- as directed on label.  Also try to eat small meals and avoid fatty, greasy or spicy foods.  Nausea with or without Vomiting:  Nausea in pregnancy is caused by increased levels of hormones in the body which influence the digestive system and cause irritation when stomach acids accumulate.  Symptoms usually subside after 1st trimester of pregnancy.  Try the following:  Keep saltines, graham crackers or dry toast by your bed  to eat upon awakening.  Don't let your stomach get empty.  Try to eat 5-6 small meals per day instead of 3 large ones.  Avoid greasy fatty or highly seasoned foods.   Take OTC Unisom 1 tablet at bed time along with OTC Vitamin B6 25-50 mg 3 times per day.    If nausea continues with vomiting and you are unable to keep down food and fluids you may need a prescription medication.  Please notify your provider.   Sore throat:  Chloraseptic spray, throat lozenges and or plain Tylenol.  Vaginal Yeast Infection:  OTC Monistat for 7 days as directed on label.  If symptoms do not resolve within a week notify provider.  If any of the above problems do not subside with recommended treatment please call the office for further assistance.   Do not take Aspirin, Advil, Motrin or Ibuprofen.  * * OTC= Over the counter Hyperemesis Gravidarum Hyperemesis gravidarum is a severe form of nausea and vomiting that happens during pregnancy. Hyperemesis is worse than morning sickness. It may cause you to have nausea or vomiting all day for many days. It may keep you from eating and drinking enough food and liquids. Hyperemesis usually occurs during the first half (the first 20 weeks) of pregnancy. It often goes away once a woman is in her second half of pregnancy. However, sometimes hyperemesis continues through an entire pregnancy.  CAUSES  The cause of this condition is not completely known but is thought to be related to changes in the body's hormones when pregnant. It could be from the high level of the pregnancy hormone or an increase in estrogen in the body.  SIGNS AND SYMPTOMS   Severe nausea and vomiting.  Nausea that does not go away.  Vomiting that does not allow you to keep any food down.  Weight loss and body fluid loss (dehydration).  Having no desire to eat or not liking food you have previously enjoyed. DIAGNOSIS  Your health care provider will do a physical exam and ask you about your symptoms.  He or she may also order blood tests and urine tests to make sure something else is not causing the problem.  TREATMENT  You may only need medicine to control the problem. If medicines do not control the nausea and vomiting, you will be treated in the hospital to prevent dehydration, increased acid in the blood (acidosis), weight loss, and changes in the electrolytes in your body that may harm the unborn baby (fetus). You may need IV fluids.  HOME CARE INSTRUCTIONS   Only take over-the-counter or prescription medicines as directed by your health care provider.  Try eating a couple of dry crackers or   toast in the morning before getting out of bed.  Avoid foods and smells that upset your stomach.  Avoid fatty and spicy foods.  Eat 5-6 small meals a day.  Do not drink when eating meals. Drink between meals.  For snacks, eat high-protein foods, such as cheese.  Eat or suck on things that have ginger in them. Ginger helps nausea.  Avoid food preparation. The smell of food can spoil your appetite.  Avoid iron pills and iron in your multivitamins until after 3-4 months of being pregnant. However, consult with your health care provider before stopping any prescribed iron pills. SEEK MEDICAL CARE IF:   Your abdominal pain increases.  You have a severe headache.  You have vision problems.  You are losing weight. SEEK IMMEDIATE MEDICAL CARE IF:   You are unable to keep fluids down.  You vomit blood.  You have constant nausea and vomiting.  You have excessive weakness.  You have extreme thirst.  You have dizziness or fainting.  You have a fever or persistent symptoms for more than 2-3 days.  You have a fever and your symptoms suddenly get worse. MAKE SURE YOU:   Understand these instructions.  Will watch your condition.  Will get help right away if you are not doing well or get worse.   This information is not intended to replace advice given to you by your health care  provider. Make sure you discuss any questions you have with your health care provider.   Document Released: 09/10/2005 Document Revised: 07/01/2013 Document Reviewed: 04/22/2013 Elsevier Interactive Patient Education 2016 [employer]. Commonly Asked Questions During Pregnancy  Cats: A parasite can be excreted in cat feces.  To avoid exposure you need to have another person empty the little box.  If you must empty the litter box you will need to wear gloves.  Wash your hands after handling your cat.  This parasite can also be found in raw or undercooked meat so this should also be avoided.  Colds, Sore Throats, Flu: Please check your medication sheet to see what you can take for symptoms.  If your symptoms are unrelieved by these medications please call the office.  Dental Work: Most any dental work your dentist recommends is permitted.  X-rays should only be taken during the first trimester if absolutely necessary.  Your abdomen should be shielded with a lead apron during all x-rays.  Please notify your provider prior to receiving any x-rays.  Novocaine is fine; gas is not recommended.  If your dentist requires a note from us prior to dental work please call the office and we will provide one for you.  Exercise: Exercise is an important part of staying healthy during your pregnancy.  You may continue most exercises you were accustomed to prior to pregnancy.  Later in your pregnancy you will most likely notice you have difficulty with activities requiring balance like riding a bicycle.  It is important that you listen to your body and avoid activities that put you at a higher risk of falling.  Adequate rest and staying well hydrated are a must!  If you have questions about the safety of specific activities ask your provider.    Exposure to Children with illness: Try to avoid obvious exposure; report any symptoms to us when noted,  If you have chicken pos, red measles or mumps, you should be immune to  these diseases.   Please do not take any vaccines while pregnant unless you have checked with   your OB provider.  Fetal Movement: After 28 weeks we recommend you do "kick counts" twice daily.  Lie or sit down in a calm quiet environment and count your baby movements "kicks".  You should feel your baby at least 10 times per hour.  If you have not felt 10 kicks within the first hour get up, walk around and have something sweet to eat or drink then repeat for an additional hour.  If count remains less than 10 per hour notify your provider.  Fumigating: Follow your pest control agent's advice as to how long to stay out of your home.  Ventilate the area well before re-entering.  Hemorrhoids:   Most over-the-counter preparations can be used during pregnancy.  Check your medication to see what is safe to use.  It is important to use a stool softener or fiber in your diet and to drink lots of liquids.  If hemorrhoids seem to be getting worse please call the office.   Hot Tubs:  Hot tubs Jacuzzis and saunas are not recommended while pregnant.  These increase your internal body temperature and should be avoided.  Intercourse:  Sexual intercourse is safe during pregnancy as long as you are comfortable, unless otherwise advised by your provider.  Spotting may occur after intercourse; report any bright red bleeding that is heavier than spotting.  Labor:  If you know that you are in labor, please go to the hospital.  If you are unsure, please call the office and let us help you decide what to do.  Lifting, straining, etc:  If your job requires heavy lifting or straining please check with your provider for any limitations.  Generally, you should not lift items heavier than that you can lift simply with your hands and arms (no back muscles)  Painting:  Paint fumes do not harm your pregnancy, but may make you ill and should be avoided if possible.  Latex or water based paints have less odor than oils.  Use adequate  ventilation while painting.  Permanents & Hair Color:  Chemicals in hair dyes are not recommended as they cause increase hair dryness which can increase hair loss during pregnancy.  " Highlighting" and permanents are allowed.  Dye may be absorbed differently and permanents may not hold as well during pregnancy.  Sunbathing:  Use a sunscreen, as skin burns easily during pregnancy.  Drink plenty of fluids; avoid over heating.  Tanning Beds:  Because their possible side effects are still unknown, tanning beds are not recommended.  Ultrasound Scans:  Routine ultrasounds are performed at approximately 20 weeks.  You will be able to see your baby's general anatomy an if you would like to know the gender this can usually be determined as well.  If it is questionable when you conceived you may also receive an ultrasound early in your pregnancy for dating purposes.  Otherwise ultrasound exams are not routinely performed unless there is a medical necessity.  Although you can request a scan we ask that you pay for it when conducted because insurance does not cover " patient request" scans.  Work: If your pregnancy proceeds without complications you may work until your due date, unless your physician or employer advises otherwise.  Round Ligament Pain/Pelvic Discomfort:  Sharp, shooting pains not associated with bleeding are fairly common, usually occurring in the second trimester of pregnancy.  They tend to be worse when standing up or when you remain standing for long periods of time.  These are the result   of pressure of certain pelvic ligaments called "round ligaments".  Rest, Tylenol and heat seem to be the most effective relief.  As the womb and fetus grow, they rise out of the pelvis and the discomfort improves.  Please notify the office if your pain seems different than that described.  It may represent a more serious condition.   

## 2015-11-23 LAB — URINALYSIS, ROUTINE W REFLEX MICROSCOPIC
BILIRUBIN UA: NEGATIVE
GLUCOSE, UA: NEGATIVE
KETONES UA: NEGATIVE
LEUKOCYTES UA: NEGATIVE
Nitrite, UA: NEGATIVE
PH UA: 7 (ref 5.0–7.5)
RBC UA: NEGATIVE
SPEC GRAV UA: 1.021 (ref 1.005–1.030)
UUROB: 1 mg/dL (ref 0.2–1.0)

## 2015-11-23 LAB — GC/CHLAMYDIA PROBE AMP
CHLAMYDIA, DNA PROBE: NEGATIVE
NEISSERIA GONORRHOEAE BY PCR: NEGATIVE

## 2015-11-23 LAB — CBC WITH DIFFERENTIAL/PLATELET
BASOS ABS: 0 10*3/uL (ref 0.0–0.2)
Basos: 0 %
EOS (ABSOLUTE): 0 10*3/uL (ref 0.0–0.4)
Eos: 1 %
Hematocrit: 40.1 % (ref 34.0–46.6)
Hemoglobin: 13.6 g/dL (ref 11.1–15.9)
Immature Grans (Abs): 0 10*3/uL (ref 0.0–0.1)
Immature Granulocytes: 0 %
LYMPHS ABS: 1.3 10*3/uL (ref 0.7–3.1)
Lymphs: 20 %
MCH: 31.1 pg (ref 26.6–33.0)
MCHC: 33.9 g/dL (ref 31.5–35.7)
MCV: 92 fL (ref 79–97)
MONOS ABS: 0.4 10*3/uL (ref 0.1–0.9)
Monocytes: 6 %
NEUTROS ABS: 4.5 10*3/uL (ref 1.4–7.0)
Neutrophils: 73 %
Platelets: 206 10*3/uL (ref 150–379)
RBC: 4.37 x10E6/uL (ref 3.77–5.28)
RDW: 12.9 % (ref 12.3–15.4)
WBC: 6.2 10*3/uL (ref 3.4–10.8)

## 2015-11-23 LAB — PAIN MGT SCRN (14 DRUGS), UR
Amphetamine Screen, Ur: NEGATIVE ng/mL
BARBITURATE SCRN UR: NEGATIVE ng/mL
BENZODIAZEPINE SCREEN, URINE: NEGATIVE ng/mL
Buprenorphine, Urine: NEGATIVE ng/mL
CANNABINOIDS UR QL SCN: NEGATIVE ng/mL
Cocaine(Metab.)Screen, Urine: NEGATIVE ng/mL
Creatinine(Crt), U: 167 mg/dL (ref 20.0–300.0)
FENTANYL, URINE: NEGATIVE pg/mL
MEPERIDINE SCREEN, URINE: NEGATIVE ng/mL
Methadone Scn, Ur: NEGATIVE ng/mL
OXYCODONE+OXYMORPHONE UR QL SCN: NEGATIVE ng/mL
Opiate Scrn, Ur: NEGATIVE ng/mL
PCP Scrn, Ur: NEGATIVE ng/mL
PH UR, DRUG SCRN: 7.2 (ref 4.5–8.9)
PROPOXYPHENE SCREEN: NEGATIVE ng/mL
Tramadol Ur Ql Scn: NEGATIVE ng/mL

## 2015-11-23 LAB — RUBELLA ANTIBODY, IGM: Rubella IgM: <20 AU/mL (ref 0.0–19.9)

## 2015-11-23 LAB — NICOTINE SCREEN, URINE: Cotinine Ql Scrn, Ur: NEGATIVE ng/mL

## 2015-11-23 LAB — HIV ANTIBODY (ROUTINE TESTING W REFLEX): HIV Screen 4th Generation wRfx: NONREACTIVE

## 2015-11-23 LAB — HEPATITIS B SURFACE ANTIGEN: Hepatitis B Surface Ag: NEGATIVE

## 2015-11-23 LAB — ANTIBODY SCREEN: ANTIBODY SCREEN: NEGATIVE

## 2015-11-23 LAB — ABO

## 2015-11-23 LAB — RPR: RPR: NONREACTIVE

## 2015-11-23 LAB — RH TYPE: Rh Factor: POSITIVE

## 2015-11-23 LAB — VARICELLA ZOSTER ANTIBODY, IGM

## 2015-11-24 LAB — URINE CULTURE, OB REFLEX: ORGANISM ID, BACTERIA: NO GROWTH

## 2015-11-24 LAB — CULTURE, OB URINE

## 2015-12-22 ENCOUNTER — Encounter: Payer: BLUE CROSS/BLUE SHIELD | Admitting: Obstetrics and Gynecology

## 2015-12-22 ENCOUNTER — Encounter: Payer: Self-pay | Admitting: Obstetrics and Gynecology

## 2015-12-22 ENCOUNTER — Ambulatory Visit (INDEPENDENT_AMBULATORY_CARE_PROVIDER_SITE_OTHER): Payer: BLUE CROSS/BLUE SHIELD | Admitting: Obstetrics and Gynecology

## 2015-12-22 VITALS — BP 99/58 | HR 60 | Wt 123.2 lb

## 2015-12-22 DIAGNOSIS — O09899 Supervision of other high risk pregnancies, unspecified trimester: Secondary | ICD-10-CM

## 2015-12-22 DIAGNOSIS — Z3481 Encounter for supervision of other normal pregnancy, first trimester: Secondary | ICD-10-CM

## 2015-12-22 DIAGNOSIS — Z3491 Encounter for supervision of normal pregnancy, unspecified, first trimester: Secondary | ICD-10-CM

## 2015-12-22 DIAGNOSIS — Z8709 Personal history of other diseases of the respiratory system: Secondary | ICD-10-CM

## 2015-12-22 DIAGNOSIS — O9989 Other specified diseases and conditions complicating pregnancy, childbirth and the puerperium: Secondary | ICD-10-CM

## 2015-12-22 DIAGNOSIS — Z283 Underimmunization status: Secondary | ICD-10-CM

## 2015-12-22 DIAGNOSIS — Z789 Other specified health status: Secondary | ICD-10-CM

## 2015-12-22 LAB — POCT URINALYSIS DIPSTICK
BILIRUBIN UA: NEGATIVE
GLUCOSE UA: NEGATIVE
Ketones, UA: NEGATIVE
Leukocytes, UA: NEGATIVE
Nitrite, UA: NEGATIVE
Protein, UA: NEGATIVE
UROBILINOGEN UA: NEGATIVE
pH, UA: 8

## 2015-12-22 NOTE — Progress Notes (Signed)
OBSTETRIC INITIAL PRENATAL VISIT  Subjective:    Susan Hurst is being seen today for her first obstetrical visit.  This is a planned pregnancy. She is a 5532w2d [phone] female at 5833w6d gestation, Estimated Date of Delivery: 06/29/16 with Patient's last menstrual period was 09/23/2015 (consistent with 7 week sono). Her obstetrical history is significant for none. Relationship with FOB: spouse, living together. Patient does intend to breast feed. Pregnancy history fully reviewed.    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     Gynecologic History:  Last pap smear was 02/2015.  Results were normal.  Admits to h/o abnormal pap smear in the past (mild dysplasia, no colposcopy required).  Denies history of STIs.    Past Medical History  Diagnosis Date  . Asthma     hx of- no meds in years  . Thyroid cyst      Family History  Problem Relation Age of Onset  . Hyperlipidemia Father   . Hypertension Father   . Polycystic ovary syndrome Sister   . Hypothyroidism Sister   . Heart failure Maternal Uncle   . Stomach cancer Paternal Grandfather   . Cancer Neg Hx   . Heart disease Neg Hx      Past Surgical History  Procedure Laterality Date  . Wisdom tooth extraction  2001    Social History   Social History  . Marital Status: Married    Spouse Name: N/A  . Number of Children: N/A  . 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 Smoking status: Never Smoker   . Smokeless tobacco: Not on file  . Alcohol Use: No  . Drug  Use: No  . Sexual Activity: Yes    Birth Control/ Protection: None   Other Topics Concern  . Not on file   Social History Narrative     Current Outpatient Prescriptions on File Prior to Visit  Medication Sig Dispense Refill  . Prenatal Vit-Fe Fumarate-FA (MULTIVITAMIN-PRENATAL) 27-0.8 MG TABS tablet Take 1 tablet by mouth daily at 12 noon. Reported on 11/22/2015     No current facility-administered medications on file prior to visit.    Allergies  Allergen Reactions  . Penicillins Hives and Rash    Review of Systems General:Not Present- Fever, Weight Loss and Weight Gain. Skin:Not Present- Rash. HEENT:Not Present- Blurred Vision, Headache and Bleeding Gums. Respiratory:Not Present- Difficulty Breathing. Breast:Not Present- Breast Mass. Cardiovascular:Not Present- Chest Pain, Elevated Blood Pressure, Fainting / Blacking Out and Shortness of Breath. Gastrointestinal:Not Present- Abdominal Pain, Constipation, Nausea and Vomiting. Female Genitourinary:Not Present- Frequency, Painful Urination, Pelvic Pain, Vaginal Bleeding, Vaginal Discharge, Contractions, regular, Fetal Movements Decreased, Urinary  Complaints and Vaginal Fluid. Musculoskeletal:Not Present- Back Pain and Leg Cramps. Neurological:Not Present- Dizziness. Psychiatric:Not Present- Depression.     Objective:   Blood pressure 99/58, pulse 60, weight 123 lb 3.2 oz (55.883 kg), last menstrual period 09/23/2015.  Body mass index is 18.74 kg/(m^2).  General Appearance:    Alert, cooperative, no distress, appears stated age  Head:    Normocephalic, without obvious abnormality, atraumatic  Eyes:    PERRL, conjunctiva/corneas clear, EOM's intact, both eyes  Ears:    Normal external ear canals, both ears  Nose:   Nares normal, septum midline, mucosa normal, no drainage or sinus tenderness  Throat:   Lips, mucosa, and tongue normal; teeth and gums normal  Neck:   Supple, symmetrical, trachea midline, no  adenopathy; thyroid: no enlargement/tenderness/nodules; no carotid bruit or JVD  Back:     Symmetric, no curvature, ROM normal, no CVA tenderness  Lungs:     Clear to auscultation bilaterally, respirations unlabored  Chest Wall:    No tenderness or deformity   Heart:    Regular rate and rhythm, S1 and S2 normal, no murmur, rub or gallop  Breast Exam:    No tenderness, masses, or nipple abnormality  Abdomen:     Soft, non-tender, bowel sounds active all four quadrants, no masses, no organomegaly.  FHT 154 bpm.  Genitalia:    Pelvic:external genitalia normal, vagina without lesions, discharge, or tenderness, rectovaginal septum  normal. Cervix normal in appearance, no cervical motion tenderness, no adnexal masses or tenderness.  Pregnancy positive findings: uterine enlargement: 13 wk size, nontender.   Rectal:    Normal external sphincter.  No hemorrhoids appreciated. Internal exam not done.   Extremities:   Extremities normal, atraumatic, no cyanosis or edema  Pulses:   2+ and symmetric all extremities  Skin:   Skin color, texture, turgor normal, no rashes or lesions  Lymph nodes:   Cervical, supraclavicular, and axillary nodes normal  Neurologic:   CNII-XII intact, normal strength, sensation and reflexes throughout      Assessment:    Pregnancy at 12 and 6/7 weeks   H/o asthma (childhood) Prior fetus with kidney dysfunction    Plan:    Initial labs reviewed. Rubella and Varicella non-immune.  Will need vaccines postpartum.  Prenatal vitamins encouraged. Problem list reviewed and updated. H/o asthma in childhood,  Has not required medications in many years.  New OB counseling:  The patient has been given an overview regarding routine prenatal care.  Recommendations regarding diet, weight gain, and exercise in pregnancy were given. Prenatal testing, optional genetic testing, and ultrasound use in pregnancy were reviewed.  AFP3 discussed: declined all genetic testing. Will monitor  current fetus for renal dysfunction beginning at anatomy scan.  Benefits of Breast Feeding were discussed. The patient is encouraged to consider nursing her baby post partum. Follow up in 4 weeks.  50% of 30 min visit spent on counseling and coordination of care.      Hildred Laser, MD [facility]

## 2015-12-22 NOTE — Patient Instructions (Signed)
RE: MyChart  Dear Susan Hurst  We are excited to introduce MyChart, a new best-in-class service that provides you online access to important information in your electronic medical record. We want to make it easier for you to view your health information - all in one secure location - when and where you need it. We expect MyChart will enhance the quality of care and service we provide. Use the activation code below to enroll in MyChart online at [email or web address]  When you register for MyChart, you can:  Marland Kitchen View your test results. . Communicate securely with your physician's office.  . View your medical history, allergies, medications, and immunizations. . Conveniently print information such as your medication lists.  If you are age 34 or older and want a member of your family to have access to your record, you must provide written consent by completing a proxy form available at our facility. Please speak to our clinical staff about guidelines regarding accounts for patients younger than age 53.  As you activate your MyChart account and need any technical assistance, please call the MyChart technical support line at [phone]) or email your question to [email or web address]. If you email your question(s), please include your name, a return phone number and the best time to reach you.  Thank you for using MyChart as your new health and wellness resource!  MyChart Activation Code:  [email or web address] Expires: 01/06/2016  2:54 PM        [facility]  [address] [place], [place] [address]     Second Trimester of Pregnancy The second trimester is from week 13 through week 28, months 4 through 6. The second trimester is often a time when you feel your best. Your body has also adjusted to being pregnant, and you begin to feel better physically. Usually, morning sickness has lessened or quit completely, you may have more energy, and you may have an  increase in appetite. The second trimester is also a time when the fetus is growing rapidly. At the end of the sixth month, the fetus is about 9 inches long and weighs about 1 pounds. You will likely begin to feel the baby move (quickening) between 18 and 20 weeks of the pregnancy. BODY CHANGES Your body goes through many changes during pregnancy. The changes vary from woman to woman.   Your weight will continue to increase. You will notice your lower abdomen bulging out.  You may begin to get stretch marks on your hips, abdomen, and breasts.  You may develop headaches that can be relieved by medicines approved by your health care provider.  You may urinate more often because the fetus is pressing on your bladder.  You may develop or continue to have heartburn as a result of your pregnancy.  You may develop constipation because certain hormones are causing the muscles that push waste through your intestines to slow down.  You may develop hemorrhoids or swollen, bulging veins (varicose veins).  You may have back pain because of the weight gain and pregnancy hormones relaxing your joints between the bones in your pelvis and as a result of a shift in weight and the muscles that support your balance.  Your breasts will continue to grow and be tender.  Your gums may bleed and may be sensitive to brushing and flossing.  Dark spots or blotches (chloasma, mask of pregnancy) may develop on your face. This will likely fade after the baby is born.  A dark line from  your belly button to the pubic area (linea nigra) may appear. This will likely fade after the baby is born.  You may have changes in your hair. These can include thickening of your hair, rapid growth, and changes in texture. Some women also have hair loss during or after pregnancy, or hair that feels dry or thin. Your hair will most likely return to normal after your baby is born. WHAT TO EXPECT AT YOUR PRENATAL VISITS During a routine  prenatal visit:  You will be weighed to make sure you and the fetus are growing normally.  Your blood pressure will be taken.  Your abdomen will be measured to track your baby's growth.  The fetal heartbeat will be listened to.  Any test results from the previous visit will be discussed. Your health care provider may ask you:  How you are feeling.  If you are feeling the baby move.  If you have had any abnormal symptoms, such as leaking fluid, bleeding, severe headaches, or abdominal cramping.  If you are using any tobacco products, including cigarettes, chewing tobacco, and electronic cigarettes.  If you have any questions. Other tests that may be performed during your second trimester include:  Blood tests that check for:  Low iron levels (anemia).  Gestational diabetes (between 24 and 28 weeks).  Rh antibodies.  Urine tests to check for infections, diabetes, or protein in the urine.  An ultrasound to confirm the proper growth and development of the baby.  An amniocentesis to check for possible genetic problems.  Fetal screens for spina bifida and Down syndrome.  HIV (human immunodeficiency virus) testing. Routine prenatal testing includes screening for HIV, unless you choose not to have this test. HOME CARE INSTRUCTIONS   Avoid all smoking, herbs, alcohol, and unprescribed drugs. These chemicals affect the formation and growth of the baby.  Do not use any tobacco products, including cigarettes, chewing tobacco, and electronic cigarettes. If you need help quitting, ask your health care provider. You may receive counseling support and other resources to help you quit.  Follow your health care provider's instructions regarding medicine use. There are medicines that are either safe or unsafe to take during pregnancy.  Exercise only as directed by your health care provider. Experiencing uterine cramps is a good sign to stop exercising.  Continue to eat regular,  healthy meals.  Wear a good support bra for breast tenderness.  Do not use hot tubs, steam rooms, or saunas.  Wear your seat belt at all times when driving.  Avoid raw meat, uncooked cheese, cat litter boxes, and soil used by cats. These carry germs that can cause birth defects in the baby.  Take your prenatal vitamins.  Take 1500-2000 mg of calcium daily starting at the 20th week of pregnancy until you deliver your baby.  Try taking a stool softener (if your health care provider approves) if you develop constipation. Eat more high-fiber foods, such as fresh vegetables or fruit and whole grains. Drink plenty of fluids to keep your urine clear or pale yellow.  Take warm sitz baths to soothe any pain or discomfort caused by hemorrhoids. Use hemorrhoid cream if your health care provider approves.  If you develop varicose veins, wear support hose. Elevate your feet for 15 minutes, 3-4 times a day. Limit salt in your diet.  Avoid heavy lifting, wear low heel shoes, and practice good posture.  Rest with your legs elevated if you have leg cramps or low back pain.  Visit your dentist  if you have not gone yet during your pregnancy. Use a soft toothbrush to brush your teeth and be gentle when you floss.  A sexual relationship may be continued unless your health care provider directs you otherwise.  Continue to go to all your prenatal visits as directed by your health care provider. SEEK MEDICAL CARE IF:   You have dizziness.  You have mild pelvic cramps, pelvic pressure, or nagging pain in the abdominal area.  You have persistent nausea, vomiting, or diarrhea.  You have a bad smelling vaginal discharge.  You have pain with urination. SEEK IMMEDIATE MEDICAL CARE IF:   You have a fever.  You are leaking fluid from your vagina.  You have spotting or bleeding from your vagina.  You have severe abdominal cramping or pain.  You have rapid weight gain or loss.  You have shortness  of breath with chest pain.  You notice sudden or extreme swelling of your face, hands, ankles, feet, or legs.  You have not felt your baby move in over an hour.  You have severe headaches that do not go away with medicine.  You have vision changes.   This information is not intended to replace advice given to you by your health care provider. Make sure you discuss any questions you have with your health care provider.   Document Released: 09/04/2001 Document Revised: 10/01/2014 Document Reviewed: 11/11/2012 Elsevier Interactive Patient Education [employer]

## 2015-12-25 DIAGNOSIS — Z2839 Other underimmunization status: Secondary | ICD-10-CM | POA: Insufficient documentation

## 2015-12-25 DIAGNOSIS — O9989 Other specified diseases and conditions complicating pregnancy, childbirth and the puerperium: Secondary | ICD-10-CM

## 2015-12-25 DIAGNOSIS — Z283 Underimmunization status: Secondary | ICD-10-CM | POA: Insufficient documentation

## 2015-12-25 DIAGNOSIS — O09899 Supervision of other high risk pregnancies, unspecified trimester: Secondary | ICD-10-CM | POA: Insufficient documentation

## 2015-12-27 ENCOUNTER — Encounter: Payer: BLUE CROSS/BLUE SHIELD | Admitting: Obstetrics and Gynecology

## 2016-01-19 ENCOUNTER — Ambulatory Visit (INDEPENDENT_AMBULATORY_CARE_PROVIDER_SITE_OTHER): Payer: BLUE CROSS/BLUE SHIELD | Admitting: Obstetrics and Gynecology

## 2016-01-19 ENCOUNTER — Encounter: Payer: BLUE CROSS/BLUE SHIELD | Admitting: Obstetrics and Gynecology

## 2016-01-19 ENCOUNTER — Encounter: Payer: Self-pay | Admitting: Obstetrics and Gynecology

## 2016-01-19 VITALS — BP 96/59 | HR 64 | Wt 125.4 lb

## 2016-01-19 DIAGNOSIS — Z3492 Encounter for supervision of normal pregnancy, unspecified, second trimester: Secondary | ICD-10-CM | POA: Insufficient documentation

## 2016-01-19 DIAGNOSIS — O9989 Other specified diseases and conditions complicating pregnancy, childbirth and the puerperium: Secondary | ICD-10-CM

## 2016-01-19 DIAGNOSIS — J302 Other seasonal allergic rhinitis: Secondary | ICD-10-CM

## 2016-01-19 DIAGNOSIS — N949 Unspecified condition associated with female genital organs and menstrual cycle: Secondary | ICD-10-CM

## 2016-01-19 DIAGNOSIS — O26899 Other specified pregnancy related conditions, unspecified trimester: Secondary | ICD-10-CM

## 2016-01-19 DIAGNOSIS — Z3482 Encounter for supervision of other normal pregnancy, second trimester: Secondary | ICD-10-CM

## 2016-01-19 DIAGNOSIS — R102 Pelvic and perineal pain: Secondary | ICD-10-CM

## 2016-01-19 LAB — POCT URINALYSIS DIPSTICK
Bilirubin, UA: NEGATIVE
Glucose, UA: NEGATIVE
KETONES UA: NEGATIVE
Leukocytes, UA: NEGATIVE
Nitrite, UA: NEGATIVE
PROTEIN UA: NEGATIVE
SPEC GRAV UA: 1.015
UROBILINOGEN UA: NEGATIVE
pH, UA: 6.5

## 2016-01-19 NOTE — Progress Notes (Signed)
ROB: Patient complains of intermittent mild cramping, denies bleeding.  Advised that this is normal, can take Tylenol prn. UA normal.  Inquires what to take for seasonal allergies, recommended Benadryl/Zyrtec/Claritin. RTC in 4 weeks.  For anatomy scan at that time.

## 2016-01-26 ENCOUNTER — Encounter: Payer: BLUE CROSS/BLUE SHIELD | Admitting: Obstetrics and Gynecology

## 2016-02-08 ENCOUNTER — Ambulatory Visit (INDEPENDENT_AMBULATORY_CARE_PROVIDER_SITE_OTHER): Payer: BLUE CROSS/BLUE SHIELD | Admitting: Obstetrics and Gynecology

## 2016-02-08 ENCOUNTER — Ambulatory Visit (INDEPENDENT_AMBULATORY_CARE_PROVIDER_SITE_OTHER): Payer: BLUE CROSS/BLUE SHIELD

## 2016-02-08 VITALS — BP 99/61 | HR 71 | Wt 125.2 lb

## 2016-02-08 DIAGNOSIS — Z3492 Encounter for supervision of normal pregnancy, unspecified, second trimester: Secondary | ICD-10-CM

## 2016-02-08 DIAGNOSIS — Z3482 Encounter for supervision of other normal pregnancy, second trimester: Secondary | ICD-10-CM

## 2016-02-09 NOTE — Progress Notes (Signed)
ROB: Patient doing well, no complaints. S/p normal anatomy scan today.  Discussed upcoming travel to beach, prevention of Zika virus exposure, given handout. RTC in 4 weeks.

## 2016-03-14 ENCOUNTER — Ambulatory Visit (INDEPENDENT_AMBULATORY_CARE_PROVIDER_SITE_OTHER): Payer: BLUE CROSS/BLUE SHIELD | Admitting: Obstetrics and Gynecology

## 2016-03-14 ENCOUNTER — Encounter: Payer: Self-pay | Admitting: Obstetrics and Gynecology

## 2016-03-14 VITALS — BP 93/54 | HR 61 | Wt 129.9 lb

## 2016-03-14 DIAGNOSIS — Z3482 Encounter for supervision of other normal pregnancy, second trimester: Secondary | ICD-10-CM

## 2016-03-14 DIAGNOSIS — Z3492 Encounter for supervision of normal pregnancy, unspecified, second trimester: Secondary | ICD-10-CM

## 2016-03-14 LAB — POCT URINALYSIS DIPSTICK
Bilirubin, UA: NEGATIVE
Blood, UA: NEGATIVE
GLUCOSE UA: NEGATIVE
KETONES UA: NEGATIVE
Nitrite, UA: NEGATIVE
Protein, UA: NEGATIVE
SPEC GRAV UA: 1.01
UROBILINOGEN UA: NEGATIVE
pH, UA: 7

## 2016-03-15 ENCOUNTER — Encounter: Payer: Self-pay | Admitting: Obstetrics and Gynecology

## 2016-03-18 NOTE — Progress Notes (Signed)
ROB: Denies complaints. Doing well.  RTC in 4 weeks. For 28 week labs at that time.

## 2016-04-11 ENCOUNTER — Ambulatory Visit (INDEPENDENT_AMBULATORY_CARE_PROVIDER_SITE_OTHER): Payer: BLUE CROSS/BLUE SHIELD | Admitting: Obstetrics and Gynecology

## 2016-04-11 ENCOUNTER — Encounter: Payer: Self-pay | Admitting: Obstetrics and Gynecology

## 2016-04-11 ENCOUNTER — Other Ambulatory Visit: Payer: BLUE CROSS/BLUE SHIELD

## 2016-04-11 VITALS — BP 93/58 | HR 57 | Wt 132.8 lb

## 2016-04-11 DIAGNOSIS — Z3493 Encounter for supervision of normal pregnancy, unspecified, third trimester: Secondary | ICD-10-CM

## 2016-04-11 DIAGNOSIS — Z3483 Encounter for supervision of other normal pregnancy, third trimester: Secondary | ICD-10-CM

## 2016-04-11 DIAGNOSIS — Z23 Encounter for immunization: Secondary | ICD-10-CM

## 2016-04-11 DIAGNOSIS — Z131 Encounter for screening for diabetes mellitus: Secondary | ICD-10-CM

## 2016-04-11 DIAGNOSIS — R001 Bradycardia, unspecified: Secondary | ICD-10-CM

## 2016-04-11 LAB — POCT URINALYSIS DIPSTICK
BILIRUBIN UA: NEGATIVE
Blood, UA: NEGATIVE
Glucose, UA: NEGATIVE
KETONES UA: NEGATIVE
Leukocytes, UA: NEGATIVE
Nitrite, UA: NEGATIVE
PROTEIN UA: NEGATIVE
SPEC GRAV UA: 1.01
Urobilinogen, UA: 0.2
pH, UA: 7.5

## 2016-04-11 MED ORDER — TETANUS-DIPHTH-ACELL PERTUSSIS 5-2.5-18.5 LF-MCG/0.5 IM SUSP
0.5000 mL | Freq: Once | INTRAMUSCULAR | Status: AC
  Administered 2016-04-11: 0.5 mL via INTRAMUSCULAR

## 2016-04-11 NOTE — Progress Notes (Signed)
ROB: C/o painful varicose veins. Advised on compression wraps, ice packs to area.   Mild bradycardia present, asymptomatic. For 28 week labs today, Tdap.  Signed blood consents.  Discussed cord blood banking. Desires to breastfeed.  Unsure of contraceptive desires (possibly OCPs). RTC in 2 weeks.

## 2016-04-12 LAB — HEMOGLOBIN AND HEMATOCRIT, BLOOD
Hematocrit: 39 % (ref 34.0–46.6)
Hemoglobin: 13.2 g/dL (ref 11.1–15.9)

## 2016-04-12 LAB — GLUCOSE TOLERANCE, 1 HOUR: GLUCOSE, 1HR PP: 113 mg/dL (ref 65–199)

## 2016-04-26 ENCOUNTER — Ambulatory Visit (INDEPENDENT_AMBULATORY_CARE_PROVIDER_SITE_OTHER): Payer: BLUE CROSS/BLUE SHIELD | Admitting: Obstetrics and Gynecology

## 2016-04-26 ENCOUNTER — Encounter: Payer: Self-pay | Admitting: Obstetrics and Gynecology

## 2016-04-26 VITALS — BP 84/50 | HR 59 | Wt 134.2 lb

## 2016-04-26 DIAGNOSIS — Z3493 Encounter for supervision of normal pregnancy, unspecified, third trimester: Secondary | ICD-10-CM

## 2016-04-26 DIAGNOSIS — O2653 Maternal hypotension syndrome, third trimester: Secondary | ICD-10-CM

## 2016-04-26 DIAGNOSIS — Z3483 Encounter for supervision of other normal pregnancy, third trimester: Secondary | ICD-10-CM

## 2016-04-26 LAB — POCT URINALYSIS DIPSTICK
Bilirubin, UA: NEGATIVE
Glucose, UA: NEGATIVE
Ketones, UA: NEGATIVE
LEUKOCYTES UA: NEGATIVE
NITRITE UA: NEGATIVE
PH UA: 7.5
PROTEIN UA: NEGATIVE
RBC UA: NEGATIVE
Spec Grav, UA: <=1.005
UROBILINOGEN UA: 0.2

## 2016-04-26 NOTE — Progress Notes (Signed)
ROB: Doing well, no complaints. BPs low, likely maternal hypotension. Discussed precautions with changing positions, possible dizzy spells.  Encouraged adequate hydration. RTC in 2 weeks.

## 2016-05-10 ENCOUNTER — Ambulatory Visit (INDEPENDENT_AMBULATORY_CARE_PROVIDER_SITE_OTHER): Payer: BLUE CROSS/BLUE SHIELD | Admitting: Obstetrics and Gynecology

## 2016-05-10 ENCOUNTER — Encounter: Payer: Self-pay | Admitting: Obstetrics and Gynecology

## 2016-05-10 VITALS — BP 94/56 | HR 72 | Wt 139.4 lb

## 2016-05-10 DIAGNOSIS — Z3483 Encounter for supervision of other normal pregnancy, third trimester: Secondary | ICD-10-CM

## 2016-05-10 DIAGNOSIS — O26843 Uterine size-date discrepancy, third trimester: Secondary | ICD-10-CM

## 2016-05-10 DIAGNOSIS — Z3493 Encounter for supervision of normal pregnancy, unspecified, third trimester: Secondary | ICD-10-CM

## 2016-05-10 LAB — POCT URINALYSIS DIPSTICK
Bilirubin, UA: NEGATIVE
Glucose, UA: NEGATIVE
KETONES UA: NEGATIVE
Nitrite, UA: NEGATIVE
PH UA: 7
PROTEIN UA: NEGATIVE
RBC UA: NEGATIVE
Urobilinogen, UA: NEGATIVE

## 2016-05-10 NOTE — Progress Notes (Signed)
ROB: Patient doing well, no complaints.  Size dates discrepancy noted although could be positional as fetus is transverse). Trend of FH has slowly been declining.  Will order growth scan. RTC in 2 weeks.

## 2016-05-11 ENCOUNTER — Ambulatory Visit (INDEPENDENT_AMBULATORY_CARE_PROVIDER_SITE_OTHER): Payer: BLUE CROSS/BLUE SHIELD

## 2016-05-11 DIAGNOSIS — O26843 Uterine size-date discrepancy, third trimester: Secondary | ICD-10-CM

## 2016-05-11 DIAGNOSIS — Z3493 Encounter for supervision of normal pregnancy, unspecified, third trimester: Secondary | ICD-10-CM

## 2016-05-11 DIAGNOSIS — Z3483 Encounter for supervision of other normal pregnancy, third trimester: Secondary | ICD-10-CM

## 2016-05-23 ENCOUNTER — Encounter: Payer: Self-pay | Admitting: Obstetrics and Gynecology

## 2016-05-23 ENCOUNTER — Ambulatory Visit (INDEPENDENT_AMBULATORY_CARE_PROVIDER_SITE_OTHER): Payer: BLUE CROSS/BLUE SHIELD | Admitting: Obstetrics and Gynecology

## 2016-05-23 VITALS — BP 96/59 | HR 57 | Wt 140.5 lb

## 2016-05-23 DIAGNOSIS — Z3493 Encounter for supervision of normal pregnancy, unspecified, third trimester: Secondary | ICD-10-CM

## 2016-05-23 DIAGNOSIS — Z3483 Encounter for supervision of other normal pregnancy, third trimester: Secondary | ICD-10-CM

## 2016-05-23 LAB — POCT URINALYSIS DIPSTICK
Bilirubin, UA: NEGATIVE
Blood, UA: NEGATIVE
GLUCOSE UA: NEGATIVE
Ketones, UA: NEGATIVE
Leukocytes, UA: NEGATIVE
NITRITE UA: NEGATIVE
Protein, UA: NEGATIVE
Spec Grav, UA: 1.015
UROBILINOGEN UA: NEGATIVE
pH, UA: 6.5

## 2016-05-23 NOTE — Progress Notes (Signed)
ROB- no complaints.  

## 2016-05-23 NOTE — Progress Notes (Signed)
Chart review 05/11/2016 growth scan-42nd percentile growth; AFI 12.3 cm; size equal dates. Good fetal movement. No uterine contractions.

## 2016-06-07 ENCOUNTER — Ambulatory Visit (INDEPENDENT_AMBULATORY_CARE_PROVIDER_SITE_OTHER): Payer: BLUE CROSS/BLUE SHIELD | Admitting: Obstetrics and Gynecology

## 2016-06-07 VITALS — BP 107/69 | HR 63 | Wt 143.1 lb

## 2016-06-07 DIAGNOSIS — Z3483 Encounter for supervision of other normal pregnancy, third trimester: Secondary | ICD-10-CM

## 2016-06-07 DIAGNOSIS — Z36 Encounter for antenatal screening of mother: Secondary | ICD-10-CM

## 2016-06-07 DIAGNOSIS — Z113 Encounter for screening for infections with a predominantly sexual mode of transmission: Secondary | ICD-10-CM | POA: Diagnosis not present

## 2016-06-07 DIAGNOSIS — Z3493 Encounter for supervision of normal pregnancy, unspecified, third trimester: Secondary | ICD-10-CM | POA: Diagnosis not present

## 2016-06-07 DIAGNOSIS — Z3685 Encounter for antenatal screening for Streptococcus B: Secondary | ICD-10-CM

## 2016-06-07 LAB — POCT URINALYSIS DIPSTICK
BILIRUBIN UA: NEGATIVE
Glucose, UA: NEGATIVE
KETONES UA: NEGATIVE
Nitrite, UA: NEGATIVE
PH UA: 7
PROTEIN UA: NEGATIVE
RBC UA: NEGATIVE
SPEC GRAV UA: 1.015
Urobilinogen, UA: NEGATIVE

## 2016-06-07 LAB — OB RESULTS CONSOLE GBS: GBS: POSITIVE

## 2016-06-07 NOTE — Progress Notes (Signed)
ROB - 36w 6d, c/o stuffy nose and lightheadedness. For 36wk cx today. Also c/o back pain and concerns about baby's position previously breech on u/s   Infant is vertex/3 cm/70%/-1/BOWI  GBS culture is done along with GC chlamydia NAAT  Herold HarmsMartin A Defrancesco, MD

## 2016-06-09 LAB — GC/CHLAMYDIA PROBE AMP
CHLAMYDIA, DNA PROBE: NEGATIVE
Neisseria gonorrhoeae by PCR: NEGATIVE

## 2016-06-14 ENCOUNTER — Ambulatory Visit (INDEPENDENT_AMBULATORY_CARE_PROVIDER_SITE_OTHER): Payer: BLUE CROSS/BLUE SHIELD | Admitting: Obstetrics and Gynecology

## 2016-06-14 VITALS — BP 120/68 | HR 61 | Wt 142.4 lb

## 2016-06-14 DIAGNOSIS — Z3493 Encounter for supervision of normal pregnancy, unspecified, third trimester: Secondary | ICD-10-CM

## 2016-06-14 DIAGNOSIS — Z3483 Encounter for supervision of other normal pregnancy, third trimester: Secondary | ICD-10-CM

## 2016-06-14 LAB — POCT URINALYSIS DIPSTICK
Bilirubin, UA: NEGATIVE
Glucose, UA: NEGATIVE
KETONES UA: NEGATIVE
Nitrite, UA: NEGATIVE
PH UA: 6.5
PROTEIN UA: NEGATIVE
RBC UA: NEGATIVE
Urobilinogen, UA: NEGATIVE

## 2016-06-15 ENCOUNTER — Inpatient Hospital Stay
Admission: EM | Admit: 2016-06-15 | Discharge: 2016-06-17 | DRG: 775 | Disposition: A | Discharge status: Home / Self Care | Payer: BLUE CROSS/BLUE SHIELD | Attending: Obstetrics and Gynecology | Admitting: Obstetrics and Gynecology

## 2016-06-15 ENCOUNTER — Encounter: Payer: Self-pay | Admitting: *Deleted

## 2016-06-15 DIAGNOSIS — IMO0001 Reserved for inherently not codable concepts without codable children: Secondary | ICD-10-CM

## 2016-06-15 DIAGNOSIS — Z8249 Family history of ischemic heart disease and other diseases of the circulatory system: Secondary | ICD-10-CM

## 2016-06-15 DIAGNOSIS — Z9889 Other specified postprocedural states: Secondary | ICD-10-CM | POA: Diagnosis not present

## 2016-06-15 DIAGNOSIS — Z3A38 38 weeks gestation of pregnancy: Secondary | ICD-10-CM

## 2016-06-15 DIAGNOSIS — Z3493 Encounter for supervision of normal pregnancy, unspecified, third trimester: Secondary | ICD-10-CM | POA: Diagnosis not present

## 2016-06-15 DIAGNOSIS — O99824 Streptococcus B carrier state complicating childbirth: Secondary | ICD-10-CM | POA: Diagnosis not present

## 2016-06-15 DIAGNOSIS — Z880 Allergy status to penicillin: Secondary | ICD-10-CM | POA: Diagnosis not present

## 2016-06-15 DIAGNOSIS — Z79899 Other long term (current) drug therapy: Secondary | ICD-10-CM

## 2016-06-15 DIAGNOSIS — Z800 Family history of malignant neoplasm of digestive organs: Secondary | ICD-10-CM

## 2016-06-15 MED ORDER — LIDOCAINE HCL (PF) 1 % IJ SOLN
INTRAMUSCULAR | Status: AC
  Administered 2016-06-15: 30 mL
  Filled 2016-06-15: qty 30

## 2016-06-15 MED ORDER — ONDANSETRON HCL 4 MG/2ML IJ SOLN: 4.0000 mg | INTRAMUSCULAR | Status: DC | PRN

## 2016-06-15 MED ORDER — WITCH HAZEL-GLYCERIN EX PADS: 1.0000 "application " | MEDICATED_PAD | CUTANEOUS | Status: DC | PRN

## 2016-06-15 MED ORDER — SIMETHICONE 80 MG PO CHEW: 80.0000 mg | CHEWABLE_TABLET | ORAL | Status: DC | PRN

## 2016-06-15 MED ORDER — DIPHENHYDRAMINE HCL 25 MG PO CAPS: 25.0000 mg | ORAL_CAPSULE | Freq: Four times a day (QID) | ORAL | Status: DC | PRN

## 2016-06-15 MED ORDER — AMMONIA AROMATIC IN INHA
RESPIRATORY_TRACT | Status: AC
  Filled 2016-06-15: qty 10

## 2016-06-15 MED ORDER — ACETAMINOPHEN 325 MG PO TABS
650.0000 mg | ORAL_TABLET | ORAL | Status: DC | PRN
Start: 2016-06-15 — End: 2016-06-17

## 2016-06-15 MED ORDER — ACETAMINOPHEN 325 MG PO TABS: 650.0000 mg | ORAL_TABLET | ORAL | Status: DC | PRN

## 2016-06-15 MED ORDER — ZOLPIDEM TARTRATE 5 MG PO TABS
5.0000 mg | ORAL_TABLET | Freq: Every evening | ORAL | Status: DC | PRN
Start: 2016-06-15 — End: 2016-06-17

## 2016-06-15 MED ORDER — OXYTOCIN 40 UNITS IN LACTATED RINGERS INFUSION - SIMPLE MED
INTRAVENOUS | Status: AC
  Filled 2016-06-15: qty 1000

## 2016-06-15 MED ORDER — MISOPROSTOL 200 MCG PO TABS
ORAL_TABLET | ORAL | Status: AC
  Filled 2016-06-15: qty 4

## 2016-06-15 MED ORDER — OXYTOCIN 10 UNIT/ML IJ SOLN: 10.0000 [IU] | Freq: Once | INTRAMUSCULAR | Status: DC

## 2016-06-15 MED ORDER — SENNOSIDES-DOCUSATE SODIUM 8.6-50 MG PO TABS
2.0000 | ORAL_TABLET | ORAL | Status: DC
  Administered 2016-06-16 – 2016-06-17 (×2): 2 via ORAL
  Filled 2016-06-15 (×2): qty 2

## 2016-06-15 MED ORDER — LIDOCAINE HCL (PF) 1 % IJ SOLN: 30.0000 mL | INTRAMUSCULAR | Status: DC | PRN

## 2016-06-15 MED ORDER — COCONUT OIL OIL: 1.0000 "application " | TOPICAL_OIL | Status: DC | PRN

## 2016-06-15 MED ORDER — OXYCODONE-ACETAMINOPHEN 5-325 MG PO TABS: 2.0000 | ORAL_TABLET | ORAL | Status: DC | PRN

## 2016-06-15 MED ORDER — IBUPROFEN 600 MG PO TABS
600.0000 mg | ORAL_TABLET | Freq: Four times a day (QID) | ORAL | Status: DC
  Administered 2016-06-15 – 2016-06-17 (×6): 600 mg via ORAL
  Filled 2016-06-15 (×7): qty 1

## 2016-06-15 MED ORDER — ONDANSETRON HCL 4 MG PO TABS: 4.0000 mg | ORAL_TABLET | ORAL | Status: DC | PRN

## 2016-06-15 MED ORDER — BUTORPHANOL TARTRATE 1 MG/ML IJ SOLN: 1.0000 mg | INTRAMUSCULAR | Status: DC | PRN

## 2016-06-15 MED ORDER — PRENATAL MULTIVITAMIN CH
1.0000 | ORAL_TABLET | Freq: Every day | ORAL | Status: DC
  Filled 2016-06-15 (×2): qty 1

## 2016-06-15 MED ORDER — BENZOCAINE-MENTHOL 20-0.5 % EX AERO: 1.0000 "application " | INHALATION_SPRAY | CUTANEOUS | Status: DC | PRN

## 2016-06-15 MED ORDER — HYDROCODONE-ACETAMINOPHEN 5-325 MG PO TABS: 1.0000 | ORAL_TABLET | ORAL | Status: DC | PRN

## 2016-06-15 MED ORDER — SOD CITRATE-CITRIC ACID 500-334 MG/5ML PO SOLN
30.0000 mL | ORAL | Status: DC | PRN
  Filled 2016-06-15: qty 30

## 2016-06-15 MED ORDER — OXYTOCIN 10 UNIT/ML IJ SOLN
INTRAMUSCULAR | Status: AC
  Administered 2016-06-15: 10 [IU]
  Filled 2016-06-15: qty 2

## 2016-06-15 MED ORDER — ONDANSETRON HCL 4 MG/2ML IJ SOLN: 4.0000 mg | Freq: Four times a day (QID) | INTRAMUSCULAR | Status: DC | PRN

## 2016-06-15 MED ORDER — OXYCODONE-ACETAMINOPHEN 5-325 MG PO TABS: 1.0000 | ORAL_TABLET | ORAL | Status: DC | PRN

## 2016-06-15 MED ORDER — DIBUCAINE 1 % RE OINT: 1.0000 "application " | TOPICAL_OINTMENT | RECTAL | Status: DC | PRN

## 2016-06-15 NOTE — H&P (Signed)
Obstetric History and Physical  Susan Hurst is a 35 y.o. [phone] with IUP at [redacted]w[redacted]d presentingin active labor. Patient states she has been having  regular, every 2-3 minutes contractions since 6:45 a.m., none vaginal bleeding, intact membranes, with active fetal movement.    Prenatal Course Source of Care: [facility] with onset of care at 8 weeks Pregnancy complications or risks: Patient Active Problem List   Diagnosis Date Noted  . Active labor at term 06/15/2016  . Rubella non-immune status, antepartum 12/25/2015  . Maternal varicella, non-immune 12/25/2015  . Follow known or suspected fetal anomaly 11/26/2012  . THYROID CYST 07/24/2010   She plans to breastfeed She is considering oral contraceptives (estrogen/progesterone) for postpartum contraception.   Prenatal labs and studies: ABO, Rh: O/Positive/-- (02/28 1158) Antibody: Negative (02/28 1158) Rubella: <20.0 (02/28 1158) RPR: Non Reactive (02/28 1158)  HBsAg: Negative (02/28 1158)  HIV: Non Reactive (02/28 1158)  GBS: Positive (09/14 1458) 1 hr Glucola  Normal Genetic screening declined Anatomy US normal   Past Medical History:  Diagnosis Date  . Asthma    hx of- no meds in years  . Microscopic hematuria 11/10/2010   Qualifier: Diagnosis of  By: Dayton Martes MD, Jovita Gamma    . Thyroid cyst   . THYROID CYST 07/24/2010   Qualifier: Diagnosis of  By: Dayton Martes MD, Talia      Past Surgical History:  Procedure Laterality Date  . 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     Social History   Social History  . Marital status: Married    Spouse name: N/A  . Number of children: N/A  . 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 Smoking status: Never Smoker  . Smokeless tobacco: Never Used  . Alcohol use No  . Drug use: No  . Sexual activity: Yes    Birth control/ protection: None   Other Topics Concern  . None   Social History Narrative  . None    Family History  Problem Relation Age of Onset  . Hyperlipidemia Father   . Hypertension Father   . Polycystic ovary syndrome Sister   . Hypothyroidism Sister   . Heart failure Maternal Uncle   . Stomach cancer Paternal Grandfather   . Cancer Neg Hx   . Heart disease Neg Hx     Prescriptions Prior to Admission  Medication Sig Dispense Refill Last Dose  . Prenatal Vit-Fe Fumarate-FA (MULTIVITAMIN-PRENATAL) 27-0.8 MG TABS tablet Take 1 tablet by mouth daily at 12 noon. Reported on 11/22/2015   Taking    Allergies  Allergen Reactions  . Penicillins Hives and Rash    Review of Systems: Negative except for what is mentioned in HPI.  Physical Exam: Ht 5\' 9"  (1.753 m)   Wt 142 lb (64.4 kg)   LMP 09/23/2015   Breastfeeding? Unknown   BMI 20.97 kg/m  CONSTITUTIONAL: Well-developed, well-nourished female in moderate distress with contractions.  HENT:  Normocephalic, atraumatic, External right and left ear normal. Oropharynx is clear and moist EYES: Conjunctivae and EOM are normal. Pupils are equal, round, and reactive to light. No scleral icterus.  NECK: Normal range of motion, supple, no masses SKIN: Skin is warm and dry. No rash noted. Not diaphoretic. No erythema. No pallor. NEUROLOGIC: Alert and oriented to person, place, and time. Normal reflexes, muscle tone coordination. No cranial nerve deficit noted. PSYCHIATRIC: Normal mood and affect. Normal behavior. Normal judgment and thought  content. CARDIOVASCULAR: Normal heart rate noted, regular rhythm RESPIRATORY: Effort and breath sounds normal, no problems with respiration noted ABDOMEN: Soft, nontender, nondistended, gravid. MUSCULOSKELETAL: Normal range of motion. No edema and no tenderness. 2+ distal pulses.  Cervical Exam: Dilatation 10cm   Effacement 100%   Station +1   Presentation: cephalic FHT:  Baseline rate 130 bpm   Variability moderate  Accelerations present   Decelerations none  (however tracing only 15 min prior to delivery) Contractions: Every 2 mins   Pertinent Labs/Studies:   Results for orders placed or performed in visit on 06/14/16 (from the past 24 hour(s))  POCT urinalysis dipstick     Status: Abnormal   Collection Time: 06/14/16 12:04 PM  Result Value Ref Range   Color, UA pale yellow    Clarity, UA clear    Glucose, UA neg    Bilirubin, UA neg    Ketones, UA neg    Spec Grav, UA <=1.005    Blood, UA neg    pH, UA 6.5    Protein, UA neg    Urobilinogen, UA negative    Nitrite, UA neg    Leukocytes, UA moderate (2+) (A) Negative    Assessment : Susan Hurst is a 35 y.o. [id number] at 2547w0d who was admitted for active labor with imminent delivery.  Plan: Labor: Expectant management.   FWB: Reassuring fetal heart tracing.  GBS positive.  Unable to treat due to imminent delivery.  Pediatricians aware.  Delivery plan: s/p normal SVD (see delivery note)    Susan LaserAnika Keniel Ralston, MD [facility]

## 2016-06-16 LAB — CBC
HEMATOCRIT: 34.7 % — AB (ref 35.0–47.0)
Hemoglobin: 12.2 g/dL (ref 12.0–16.0)
MCH: 32.6 pg (ref 26.0–34.0)
MCHC: 35.2 g/dL (ref 32.0–36.0)
MCV: 92.6 fL (ref 80.0–100.0)
PLATELETS: 147 10*3/uL — AB (ref 150–440)
RBC: 3.74 MIL/uL — ABNORMAL LOW (ref 3.80–5.20)
RDW: 12.9 % (ref 11.5–14.5)
WBC: 11 10*3/uL (ref 3.6–11.0)

## 2016-06-16 LAB — STREP GP B NAA+RFLX: STREP GP B NAA+RFLX: POSITIVE — AB

## 2016-06-16 LAB — STREP GP B SUSCEPTIBILITY

## 2016-06-16 NOTE — Progress Notes (Signed)
Post Partum Day # 1, s/p SVD with 2nd degree perineal laceration repair  Subjective: no complaints, up ad lib, voiding and tolerating PO.  Objective: Temp:  [97.5 F (36.4 C)-98.7 F (37.1 C)] 98.7 F (37.1 C) (09/23 1311) Pulse Rate:  [48-64] 51 (09/23 0835) Resp:  [18-19] 19 (09/23 0413) BP: (110-129)/(62-74) 117/65 (09/23 0835) SpO2:  [98 %-99 %] 99 % (09/23 0835)  Physical Exam:  General: alert and no distress  Lungs: clear to auscultation bilaterally Breasts: normal appearance, no masses or tenderness Heart: regular rate and rhythm, S1, S2 normal, no murmur, click, rub or gallop Pelvis: Lochia: appropriate, Uterine Fundus: firm Extremities: DVT Evaluation: No evidence of DVT seen on physical exam. Negative Homan's sign. No cords or calf tenderness.   Recent Labs  06/16/16 0733  HGB 12.2  HCT 34.7*    Assessment/Plan: Doing well s/p SVD Continue routine postpartum care Breastfeeding  Contraception OCPs Plan for discharge tomorrow    LOS: 1 day   Hildred LaserAnika Denishia Citro [facility]

## 2016-06-17 LAB — RAPID HIV SCREEN (HIV 1/2 AB+AG)
HIV 1/2 Antibodies: NONREACTIVE
HIV-1 P24 ANTIGEN - HIV24: NONREACTIVE

## 2016-06-17 MED ORDER — IBUPROFEN 600 MG PO TABS: 600.0000 mg | ORAL_TABLET | Freq: Four times a day (QID) | ORAL | 1 refills | Status: DC

## 2016-06-17 MED ORDER — MEASLES, MUMPS & RUBELLA VAC ~~LOC~~ INJ
0.5000 mL | INJECTION | Freq: Once | SUBCUTANEOUS | Status: AC
  Administered 2016-06-17: 0.5 mL via SUBCUTANEOUS
  Filled 2016-06-17: qty 0.5

## 2016-06-17 MED ORDER — VARICELLA VIRUS VACCINE LIVE 1350 PFU/0.5ML IJ SUSR
0.5000 mL | INTRAMUSCULAR | Status: AC | PRN
  Administered 2016-06-17: 0.5 mL via SUBCUTANEOUS
  Filled 2016-06-17: qty 0.5

## 2016-06-17 MED ORDER — INFLUENZA VAC SPLIT QUAD 0.5 ML IM SUSY
0.5000 mL | PREFILLED_SYRINGE | INTRAMUSCULAR | Status: AC | PRN
  Administered 2016-06-17: 0.5 mL via INTRAMUSCULAR
  Filled 2016-06-17: qty 0.5

## 2016-06-17 NOTE — Discharge Instructions (Signed)

## 2016-06-17 NOTE — Progress Notes (Signed)
Patient discharge to home via wheelchair with spouse and baby in car seat.  

## 2016-06-17 NOTE — Discharge Summary (Signed)
Obstetric Discharge Summary Reason for Admission: onset of labor Prenatal Procedures: ultrasound Intrapartum Procedures: spontaneous vaginal delivery Postpartum Procedures: Rubella Ig and Varivax and Influenza vaccine Complications-Operative and Postpartum: 2nd degree perineal laceration Hemoglobin  Date Value Ref Range Status  06/16/2016 12.2 12.0 - 16.0 g/dL Final   HCT  Date Value Ref Range Status  06/16/2016 34.7 (L) 35.0 - 47.0 % Final   Hematocrit  Date Value Ref Range Status  04/11/2016 39.0 34.0 - 46.6 % Final    Physical Exam:  General: alert and no distress Lochia: appropriate Uterine Fundus: firm Incision: None DVT Evaluation: No evidence of DVT seen on physical exam. Negative Homan's sign. No cords or calf tenderness. No significant calf/ankle edema.  Discharge Diagnoses: Term Pregnancy-delivered  Discharge Information: Date: 06/17/2016 Activity: pelvic rest Diet: routine Medications: PNV and Ibuprofen Condition: stable Instructions: refer to practice specific booklet Discharge to: home Follow-up Information    Susan LaserAnika Susan Henion, MD Follow up in 6 week(s).   Specialties:  Obstetrics and Gynecology, Radiology Why:  Postpartum visit Contact information: [address] [place] [address] [phone]           Newborn Data: Live born female  Birth Weight: 6 lb 4.2 oz (2840 g) APGAR: 8, 9  Home with mother.  Susan Lasernika Susan Hurst 06/17/2016, 11:08 AM

## 2016-06-17 NOTE — Progress Notes (Signed)
Post Partum Day # 2, s/p SVD with 2nd degree perineal laceration repair  Subjective: no complaints, up ad lib, voiding and tolerating PO.  Objective: Blood pressure 113/64, pulse (!) 58, temperature 97.8 F (36.6 C), temperature source Oral, resp. rate 17, height 5\' 9"  (1.753 m), weight 142 lb (64.4 kg), last menstrual period 09/23/2015, SpO2 100 %, unknown if currently breastfeeding.  Physical Exam:  General: alert and no distress  Lungs: clear to auscultation bilaterally Breasts: normal appearance, no masses or tenderness Heart: regular rate and rhythm, S1, S2 normal, no murmur, click, rub or gallop Pelvis: Lochia: appropriate, Uterine Fundus: firm Extremities: DVT Evaluation: No evidence of DVT seen on physical exam. Negative Homan's sign. No cords or calf tenderness.   Recent Labs  06/16/16 0733  HGB 12.2  HCT 34.7*    Assessment/Plan: Doing well s/p SVD Continue routine postpartum care Breastfeeding  Contraception OCPs Plan for discharge today.     LOS: 2 days   Hildred LaserAnika Whitnee Orzel [facility]

## 2016-06-17 NOTE — Progress Notes (Signed)
ROB: Notes steady regular contractions yesterday, which subsided last night.  Still noting occasional contractions today but less painful.  Labor precautions reiterated.  Is GBS+, will need antibiotics in labor.  RTC in 1 week.

## 2016-06-18 LAB — RPR: RPR Ser Ql: NONREACTIVE

## 2016-06-21 ENCOUNTER — Encounter: Payer: BLUE CROSS/BLUE SHIELD | Admitting: Obstetrics and Gynecology

## 2016-06-28 ENCOUNTER — Encounter: Payer: BLUE CROSS/BLUE SHIELD | Admitting: Obstetrics and Gynecology

## 2016-07-31 ENCOUNTER — Ambulatory Visit (INDEPENDENT_AMBULATORY_CARE_PROVIDER_SITE_OTHER): Payer: BLUE CROSS/BLUE SHIELD | Admitting: Obstetrics and Gynecology

## 2016-07-31 ENCOUNTER — Encounter: Payer: Self-pay | Admitting: Obstetrics and Gynecology

## 2016-07-31 DIAGNOSIS — N912 Amenorrhea, unspecified: Secondary | ICD-10-CM

## 2016-07-31 DIAGNOSIS — Z30011 Encounter for initial prescription of contraceptive pills: Secondary | ICD-10-CM

## 2016-07-31 MED ORDER — NORETHINDRONE 0.35 MG PO TABS: 1.0000 | ORAL_TABLET | Freq: Every day | ORAL | 11 refills | Status: DC

## 2016-07-31 NOTE — Progress Notes (Signed)
   OBSTETRICS POSTPARTUM CLINIC PROGRESS NOTE  Subjective:     Susan Hurst is a 35 y.o. [phone] female who presents for a postpartum visit. She is 7 weeks postpartum following a spontaneous vaginal delivery. I have fully reviewed the prenatal and intrapartum course. The delivery was at 38w 0d gestational weeks.  Anesthesia: none. Postpartum course has been well. Baby's course has been well. Baby is feeding by breast. Bleeding: patient has not resumed menses, with No LMP recorded. Patient is not currently having periods (Reason: Lactating).. Bowel function is normal. Bladder function is normal. Patient is not sexually active. Contraception method desired is oral progesterone-only contraceptive. Postpartum depression screening: negative (PHQ-9 score is 3).  The following portions of the patient's history were reviewed and updated as appropriate: allergies, current medications, past family history, past medical history, past social history, past surgical history and problem list.  Review of Systems Pertinent items noted in HPI and remainder of comprehensive ROS otherwise negative.   Objective:    BP 107/69 (BP Location: Left Arm, Patient Position: Sitting, Cuff Size: Normal)   Pulse (!) 54   Ht 5\' 8"  (1.727 m)   Wt 127 lb 8 oz (57.8 kg)   Breastfeeding? Yes   BMI 19.39 kg/m   General:  alert and no distress   Breasts:  inspection negative, no nipple discharge or bleeding, no masses or nodularity palpable  Lungs: clear to auscultation bilaterally  Heart:  regular rate and rhythm, S1, S2 normal, no murmur, click, rub or gallop  Abdomen: soft, non-tender; bowel sounds normal; no masses,  no organomegaly.    Vulva:  normal  Vagina: normal vagina, no discharge, exudate, lesion, or erythema  Cervix:  no cervical motion tenderness and no lesions  Corpus: normal size, contour, position, consistency, mobility, non-tender  Adnexa:  normal adnexa and no mass, fullness, tenderness  Rectal Exam:  Not performed.         Labs:  Lab Results  Component Value Date   HGB 12.2 06/16/2016     Assessment:   Routine postpartum exam. S/p vaginal delivery   Lactational amenorrhea Contraception management  Plan:    1. Contraception: oral progesterone-only contraceptive.  Will prescribe Micronor. Advised on Sunday start, use back up method x 1 month.  2. Will check Hgb for h/o anemia.  3. Follow up in: 4-6 months for annual exam, or as needed.    Hildred LaserAnika Tearsa Kowalewski, MD [facility]

## 2017-01-24 NOTE — Progress Notes (Signed)
ANNUAL PREVENTATIVE CARE GYN  ENCOUNTER NOTE  Subjective:       Susan Hurst is a 36 y.o. [phone] female here for a routine annual gynecologic exam.  Current complaints: 1.  None     Gynecologic History No LMP recorded. Patient is not currently having periods (Reason: Lactating). Contraception: oral progesterone-only contraceptive Last Pap: 01/2015 neg. Results were: normal Last mammogram: n/a. 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     Past Medical History:  Diagnosis Date  . Asthma    hx of- no meds in years  . Microscopic hematuria 11/10/2010   Qualifier: Diagnosis of  By: Dayton Martes MD, Jovita Gamma    . Thyroid cyst   . THYROID CYST 07/24/2010   Qualifier: Diagnosis of  By: Dayton Martes MD, Talia      Past Surgical History:  Procedure Laterality Date  . WISDOM TOOTH EXTRACTION  2001    Current Outpatient Prescriptions on File Prior to Visit  Medication Sig Dispense Refill  . norethindrone (MICRONOR,CAMILA,ERRIN) 0.35 MG tablet Take 1 tablet (0.35 mg total) by mouth daily. 1 Package 11  . Prenatal Vit-Fe Fumarate-FA (MULTIVITAMIN-PRENATAL) 27-0.8 MG TABS tablet Take 1 tablet by mouth daily at 12 noon. Reported on 11/22/2015     No current facility-administered medications on file prior to visit.     Allergies  Allergen Reactions  . Penicillins Hives and Rash    Social History   Social History  . Marital status: Married    Spouse name: N/A  . Number of children: N/A  .  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 Smoking status: Never Smoker  . Smokeless tobacco: Never Used  . Alcohol use No  . Drug use: No  . Sexual activity: No   Other Topics Concern  . Not on file   Social History Narrative  . No narrative on file    Family History  Problem Relation Age of Onset  . Hyperlipidemia Father   . Hypertension Father   . Polycystic ovary syndrome Sister   . Hypothyroidism Sister   . Heart failure Maternal Uncle   . Stomach cancer Paternal Grandfather   . Cancer Neg Hx   . Heart disease Neg Hx     The following portions of the patient's history were reviewed and updated as appropriate: allergies, current medications, past family history, past medical history, past social history, past surgical history and problem list.  Review of Systems Review of Systems  Constitutional: Negative.   HENT: Negative.   Eyes: Negative.   Respiratory: Negative.   Cardiovascular: Negative.   Gastrointestinal: Negative.   Genitourinary: Negative.   Musculoskeletal: Negative.   Skin: Negative.   Neurological: Negative.   Endo/Heme/Allergies:  Negative.   Psychiatric/Behavioral: Negative.    Review of Systems - General ROS: negative for - chills, fatigue, fever, hot flashes, night sweats, weight gain or weight loss Psychological ROS: negative for - anxiety, decreased libido, depression, mood swings, physical abuse or sexual abuse Ophthalmic ROS: negative for - blurry vision, eye pain or loss of vision ENT ROS: negative for - headaches, hearing change, visual changes or vocal changes Allergy and Immunology ROS: negative for - hives, itchy/watery eyes or seasonal allergies Hematological and Lymphatic ROS: negative for - bleeding problems, bruising, swollen lymph nodes or weight loss Endocrine ROS: negative for - galactorrhea, hair pattern changes, hot flashes, malaise/lethargy, mood swings,  palpitations, polydipsia/polyuria, skin changes, temperature intolerance or unexpected weight changes Breast ROS: negative for - new or changing breast lumps or nipple discharge Respiratory ROS: negative for - cough or shortness of breath Cardiovascular ROS: negative for - chest pain, irregular heartbeat, palpitations or shortness of breath Gastrointestinal ROS: no abdominal pain, change in bowel habits, or black or bloody stools Genito-Urinary ROS: no dysuria, trouble voiding, or hematuria Musculoskeletal ROS: negative for - joint pain or joint stiffness Neurological ROS: negative for - bowel and bladder control changes Dermatological ROS: negative for rash and skin lesion changes   Objective:   BP 104/69   Pulse (!) 58   Ht 5\' 8"  (1.727 m)   Wt 117 lb 14.4 oz (53.5 kg)   LMP 08/09/2016 (Exact Date)   Breastfeeding? Yes   BMI 17.93 kg/m  CONSTITUTIONAL: Well-developed, well-nourished female in no acute distress.  PSYCHIATRIC: Normal mood and affect. Normal behavior. Normal judgment and thought content. NEUROLGIC: Alert and oriented to person, place, and time. Normal muscle tone coordination. No cranial nerve deficit noted. HENT:  Normocephalic, atraumatic, External right and left ear normal. Oropharynx is clear and moist EYES: Conjunctivae and EOM are normal. Pupils are equal, round, and reactive to light. No scleral icterus.  NECK: Normal range of motion, supple, no masses.  Normal thyroid.  SKIN: Skin is warm and dry. No rash noted. Not diaphoretic. No erythema. No pallor. CARDIOVASCULAR: Normal heart rate noted, regular rhythm, no murmur. RESPIRATORY: Clear to auscultation bilaterally. Effort and breath sounds normal, no problems with respiration noted. BREASTS: Symmetric in size. No masses, skin changes, nipple drainage, or lymphadenopathy. Lactation changes present ABDOMEN: Soft, normal bowel sounds, no distention noted.  No tenderness, rebound or guarding.  BLADDER:  Normal PELVIC:  External Genitalia: Normal  BUS: Normal  Vagina: Normal; fair estrogen effect  Cervix: Normal; no cervical motion tenderness; no lesions  Uterus: small and midplane, mobile, nontender  Adnexa: Normal; nonpalpable nontender  RV: external exam normal MUSCULOSKELETAL: Normal range of motion. No tenderness.  No cyanosis, clubbing, or edema.  2+ distal pulses. LYMPHATIC: No Axillary, Supraclavicular, or Inguinal Adenopathy.    Assessment:   Annual gynecologic examination 36 y.o. Contraception: oral progesterone-only contraceptive [id number] Problem List Items Addressed This Visit    None    Visit Diagnoses    Well woman exam with routine gynecological exam    -  Primary    Patient now needs combined oral contraceptive because of infant on solids at this time  Plan:  Pap: Pap Co Test Mammogram: Not Indicated Stool Guaiac Testing:  Not Indicated Labs: vit d tsh lipid fbs a1c Routine preventative health maintenance measures emphasized: Exercise/Diet/Weight Control; counseled on alcohol/substance abuse, stress management  Patient's progesterone-only contraceptive will be switched to low-estrogen combined OCP. Patient informed that it may lessen breast milk production.  Return  to Clinic - 1 Year   Crystal [place], [place]  Herold Harms, MD  Note: This dictation was prepared with Dragon dictation along with smaller phrase technology. Any transcriptional errors that result from this process are unintentional.

## 2017-01-29 ENCOUNTER — Ambulatory Visit (INDEPENDENT_AMBULATORY_CARE_PROVIDER_SITE_OTHER): Payer: BLUE CROSS/BLUE SHIELD | Admitting: Obstetrics and Gynecology

## 2017-01-29 ENCOUNTER — Encounter: Payer: Self-pay | Admitting: Obstetrics and Gynecology

## 2017-01-29 VITALS — BP 104/69 | HR 58 | Ht 68.0 in | Wt 117.9 lb

## 2017-01-29 DIAGNOSIS — N912 Amenorrhea, unspecified: Secondary | ICD-10-CM | POA: Diagnosis not present

## 2017-01-29 DIAGNOSIS — R636 Underweight: Secondary | ICD-10-CM

## 2017-01-29 DIAGNOSIS — Z01419 Encounter for gynecological examination (general) (routine) without abnormal findings: Secondary | ICD-10-CM | POA: Diagnosis not present

## 2017-01-29 DIAGNOSIS — Z3041 Encounter for surveillance of contraceptive pills: Secondary | ICD-10-CM | POA: Diagnosis not present

## 2017-01-29 MED ORDER — NORETHIN-ETH ESTRAD-FE BIPHAS 1 MG-10 MCG / 10 MCG PO TABS: 1.0000 | ORAL_TABLET | Freq: Every day | ORAL | 11 refills | Status: DC

## 2017-01-29 NOTE — Patient Instructions (Signed)
1. Pap smear 2. Self breast minus encouraged 3. Screening labs are ordered 4. Continue with healthy eating and exercise 5. Discontinue Micronor; begin Lo Loestrin oral contraceptive 6. Return in 1 year for annual exam  Health Maintenance, Female Adopting a healthy lifestyle and getting preventive care can go a long way to promote health and wellness. Talk with your health care provider about what schedule of regular examinations is right for you. This is a good chance for you to check in with your provider about disease prevention and staying healthy. In between checkups, there are plenty of things you can do on your own. Experts have done a lot of research about which lifestyle changes and preventive measures are most likely to keep you healthy. Ask your health care provider for more information. Weight and diet Eat a healthy diet  Be sure to include plenty of vegetables, fruits, low-fat dairy products, and lean protein.  Do not eat a lot of foods high in solid fats, added sugars, or salt.  Get regular exercise. This is one of the most important things you can do for your health.  Most adults should exercise for at least 150 minutes each week. The exercise should increase your heart rate and make you sweat (moderate-intensity exercise).  Most adults should also do strengthening exercises at least twice a week. This is in addition to the moderate-intensity exercise. Maintain a healthy weight  Body mass index (BMI) is a measurement that can be used to identify possible weight problems. It estimates body fat based on height and weight. Your health care provider can help determine your BMI and help you achieve or maintain a healthy weight.  For females 3 years of age and older:  A BMI below 18.5 is considered underweight.  A BMI of 18.5 to 24.9 is normal.  A BMI of 25 to 29.9 is considered overweight.  A BMI of 30 and above is considered obese. Watch levels of cholesterol and blood  lipids  You should start having your blood tested for lipids and cholesterol at 35 years of age, then have this test every 5 years.  You may need to have your cholesterol levels checked more often if:  Your lipid or cholesterol levels are high.  You are older than 35 years of age.  You are at high risk for heart disease. Cancer screening Lung Cancer  Lung cancer screening is recommended for adults 37-79 years old who are at high risk for lung cancer because of a history of smoking.  A yearly low-dose CT scan of the lungs is recommended for people who:  Currently smoke.  Have quit within the past 15 years.  Have at least a 30-pack-year history of smoking. A pack year is smoking an average of one pack of cigarettes a day for 1 year.  Yearly screening should continue until it has been 15 years since you quit.  Yearly screening should stop if you develop a health problem that would prevent you from having lung cancer treatment. Breast Cancer  Practice breast self-awareness. This means understanding how your breasts normally appear and feel.  It also means doing regular breast self-exams. Let your health care provider know about any changes, no matter how small.  If you are in your 20s or 30s, you should have a clinical breast exam (CBE) by a health care provider every 1-3 years as part of a regular health exam.  If you are 77 or older, have a CBE every year. Also consider  having a breast X-ray (mammogram) every year.  If you have a family history of breast cancer, talk to your health care provider about genetic screening.  If you are at high risk for breast cancer, talk to your health care provider about having an MRI and a mammogram every year.  Breast cancer gene (BRCA) assessment is recommended for women who have family members with BRCA-related cancers. BRCA-related cancers include:  Breast.  Ovarian.  Tubal.  Peritoneal cancers.  Results of the assessment will  determine the need for genetic counseling and BRCA1 and BRCA2 testing. Cervical Cancer  Your health care provider may recommend that you be screened regularly for cancer of the pelvic organs (ovaries, uterus, and vagina). This screening involves a pelvic examination, including checking for microscopic changes to the surface of your cervix (Pap test). You may be encouraged to have this screening done every 3 years, beginning at age 35.  For women ages 74-65, health care providers may recommend pelvic exams and Pap testing every 3 years, or they may recommend the Pap and pelvic exam, combined with testing for human papilloma virus (HPV), every 5 years. Some types of HPV increase your risk of cervical cancer. Testing for HPV may also be done on women of any age with unclear Pap test results.  Other health care providers may not recommend any screening for nonpregnant women who are considered low risk for pelvic cancer and who do not have symptoms. Ask your health care provider if a screening pelvic exam is right for you.  If you have had past treatment for cervical cancer or a condition that could lead to cancer, you need Pap tests and screening for cancer for at least 20 years after your treatment. If Pap tests have been discontinued, your risk factors (such as having a new sexual partner) need to be reassessed to determine if screening should resume. Some women have medical problems that increase the chance of getting cervical cancer. In these cases, your health care provider may recommend more frequent screening and Pap tests. Colorectal Cancer  This type of cancer can be detected and often prevented.  Routine colorectal cancer screening usually begins at 35 years of age and continues through 35 years of age.  Your health care provider may recommend screening at an earlier age if you have risk factors for colon cancer.  Your health care provider may also recommend using home test kits to check for  hidden blood in the stool.  A small camera at the end of a tube can be used to examine your colon directly (sigmoidoscopy or colonoscopy). This is done to check for the earliest forms of colorectal cancer.  Routine screening usually begins at age 70.  Direct examination of the colon should be repeated every 5-10 years through 35 years of age. However, you may need to be screened more often if early forms of precancerous polyps or small growths are found. Skin Cancer  Check your skin from head to toe regularly.  Tell your health care provider about any new moles or changes in moles, especially if there is a change in a mole's shape or color.  Also tell your health care provider if you have a mole that is larger than the size of a pencil eraser.  Always use sunscreen. Apply sunscreen liberally and repeatedly throughout the day.  Protect yourself by wearing long sleeves, pants, a wide-brimmed hat, and sunglasses whenever you are outside. Heart disease, diabetes, and high blood pressure  High blood  pressure causes heart disease and increases the risk of stroke. High blood pressure is more likely to develop in:  People who have blood pressure in the high end of the normal range (130-139/85-89 mm Hg).  People who are overweight or obese.  People who are African American.  If you are 52-14 years of age, have your blood pressure checked every 3-5 years. If you are 44 years of age or older, have your blood pressure checked every year. You should have your blood pressure measured twice-once when you are at a hospital or clinic, and once when you are not at a hospital or clinic. Record the average of the two measurements. To check your blood pressure when you are not at a hospital or clinic, you can use:  An automated blood pressure machine at a pharmacy.  A home blood pressure monitor.  If you are between 85 years and 70 years old, ask your health care provider if you should take aspirin to  prevent strokes.  Have regular diabetes screenings. This involves taking a blood sample to check your fasting blood sugar level.  If you are at a normal weight and have a low risk for diabetes, have this test once every three years after 35 years of age.  If you are overweight and have a high risk for diabetes, consider being tested at a younger age or more often. Preventing infection Hepatitis B  If you have a higher risk for hepatitis B, you should be screened for this virus. You are considered at high risk for hepatitis B if:  You were born in a country where hepatitis B is common. Ask your health care provider which countries are considered high risk.  Your parents were born in a high-risk country, and you have not been immunized against hepatitis B (hepatitis B vaccine).  You have HIV or AIDS.  You use needles to inject street drugs.  You live with someone who has hepatitis B.  You have had sex with someone who has hepatitis B.  You get hemodialysis treatment.  You take certain medicines for conditions, including cancer, organ transplantation, and autoimmune conditions. Hepatitis C  Blood testing is recommended for:  Everyone born from [birth year] through 1964.  Anyone with known risk factors for hepatitis C. Sexually transmitted infections (STIs)  You should be screened for sexually transmitted infections (STIs) including gonorrhea and chlamydia if:  You are sexually active and are younger than 35 years of age.  You are older than 35 years of age and your health care provider tells you that you are at risk for this type of infection.  Your sexual activity has changed since you were last screened and you are at an increased risk for chlamydia or gonorrhea. Ask your health care provider if you are at risk.  If you do not have HIV, but are at risk, it may be recommended that you take a prescription medicine daily to prevent HIV infection. This is called pre-exposure  prophylaxis (PrEP). You are considered at risk if:  You are sexually active and do not regularly use condoms or know the HIV status of your partner(s).  You take drugs by injection.  You are sexually active with a partner who has HIV. Talk with your health care provider about whether you are at high risk of being infected with HIV. If you choose to begin PrEP, you should first be tested for HIV. You should then be tested every 3 months for as long as you  are taking PrEP. Pregnancy  If you are premenopausal and you may become pregnant, ask your health care provider about preconception counseling.  If you may become pregnant, take 400 to 800 micrograms (mcg) of folic acid every day.  If you want to prevent pregnancy, talk to your health care provider about birth control (contraception). Osteoporosis and menopause  Osteoporosis is a disease in which the bones lose minerals and strength with aging. This can result in serious bone fractures. Your risk for osteoporosis can be identified using a bone density scan.  If you are 46 years of age or older, or if you are at risk for osteoporosis and fractures, ask your health care provider if you should be screened.  Ask your health care provider whether you should take a calcium or vitamin D supplement to lower your risk for osteoporosis.  Menopause may have certain physical symptoms and risks.  Hormone replacement therapy may reduce some of these symptoms and risks. Talk to your health care provider about whether hormone replacement therapy is right for you. Follow these instructions at home:  Schedule regular health, dental, and eye exams.  Stay current with your immunizations.  Do not use any tobacco products including cigarettes, chewing tobacco, or electronic cigarettes.  If you are pregnant, do not drink alcohol.  If you are breastfeeding, limit how much and how often you drink alcohol.  Limit alcohol intake to no more than 1 drink  per day for nonpregnant women. One drink equals 12 ounces of beer, 5 ounces of wine, or 1 ounces of hard liquor.  Do not use street drugs.  Do not share needles.  Ask your health care provider for help if you need support or information about quitting drugs.  Tell your health care provider if you often feel depressed.  Tell your health care provider if you have ever been abused or do not feel safe at home. This information is not intended to replace advice given to you by your health care provider. Make sure you discuss any questions you have with your health care provider. Document Released: 03/26/2011 Document Revised: 02/16/2016 Document Reviewed: 06/14/2015 Elsevier Interactive Patient Education  2017 [employer].

## 2017-02-01 LAB — PAP IG AND HPV HIGH-RISK
HPV, HIGH-RISK: NEGATIVE
PAP SMEAR COMMENT: 0

## 2017-02-12 ENCOUNTER — Other Ambulatory Visit: Payer: BLUE CROSS/BLUE SHIELD

## 2017-02-13 ENCOUNTER — Other Ambulatory Visit: Payer: BLUE CROSS/BLUE SHIELD

## 2017-02-13 DIAGNOSIS — Z01419 Encounter for gynecological examination (general) (routine) without abnormal findings: Secondary | ICD-10-CM | POA: Diagnosis not present

## 2017-02-14 LAB — LIPID PANEL
CHOL/HDL RATIO: 2.8 ratio (ref 0.0–4.4)
Cholesterol, Total: 167 mg/dL (ref 100–199)
HDL: 59 mg/dL (ref >39)
LDL Calculated: 98 mg/dL (ref 0–99)
TRIGLYCERIDES: 48 mg/dL (ref 0–149)
VLDL CHOLESTEROL CAL: 10 mg/dL (ref 5–40)

## 2017-02-14 LAB — HEMOGLOBIN A1C
ESTIMATED AVERAGE GLUCOSE: 103 mg/dL
Hgb A1c MFr Bld: 5.2 % (ref 4.8–5.6)

## 2017-02-14 LAB — GLUCOSE, RANDOM: Glucose: 78 mg/dL (ref 65–99)

## 2017-02-14 LAB — VITAMIN D 25 HYDROXY (VIT D DEFICIENCY, FRACTURES): Vit D, 25-Hydroxy: 33.7 ng/mL (ref 30.0–100.0)

## 2017-02-14 LAB — TSH: TSH: 1.21 u[IU]/mL (ref 0.450–4.500)

## 2017-03-13 ENCOUNTER — Ambulatory Visit (INDEPENDENT_AMBULATORY_CARE_PROVIDER_SITE_OTHER): Payer: BLUE CROSS/BLUE SHIELD | Admitting: Family Medicine

## 2017-03-13 ENCOUNTER — Encounter: Payer: Self-pay | Admitting: Family Medicine

## 2017-03-13 VITALS — BP 110/70 | HR 70 | Temp 98.8°F | Wt 117.5 lb

## 2017-03-13 DIAGNOSIS — R109 Unspecified abdominal pain: Secondary | ICD-10-CM | POA: Diagnosis not present

## 2017-03-13 NOTE — Progress Notes (Signed)
(  S) Susan Hurst is a 36 y.o. female with complaint of gastrointestinal symptoms of fevers, watery diarrhea, abdominal pain, lower abdominal cramps, vomiting for 2 days. No blood in stool.  Current Outpatient Prescriptions on File Prior to Visit  Medication Sig Dispense Refill  . Norethindrone-Ethinyl Estradiol-Fe Biphas (LO LOESTRIN FE) 1 MG-10 MCG / 10 MCG tablet Take 1 tablet by mouth daily. 1 Package 11  . Prenatal Vit-Fe Fumarate-FA (MULTIVITAMIN-PRENATAL) 27-0.8 MG TABS tablet Take 1 tablet by mouth daily at 12 noon. Reported on 11/22/2015     No current facility-administered medications on file prior to visit.     Allergies  Allergen Reactions  . Penicillins Hives and Rash    Past Medical History:  Diagnosis Date  . Asthma    hx of- no meds in years  . Microscopic hematuria 11/10/2010   Qualifier: Diagnosis of  By: Dayton MartesAron MD, Jovita Gammaalia    . Thyroid cyst   . THYROID CYST 07/24/2010   Qualifier: Diagnosis of  By: Dayton MartesAron MD, Nereyda Bowler      Past Surgical History:  Procedure Laterality Date  . WISDOM TOOTH EXTRACTION  2001    Family History  Problem Relation Age of Onset  . Hyperlipidemia Father   . Hypertension Father   . Polycystic ovary syndrome Sister   . Hypothyroidism Sister   . Heart failure Maternal Uncle   . Stomach cancer Paternal Grandfather   . Cancer Neg Hx   . Heart disease Neg Hx     Social History   Social History  . Marital status: Married    Spouse name: N/A  . Number of children: N/A  . 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 Smoking status: Never Smoker  . Smokeless tobacco: Never Used  . Alcohol use No  . Drug use: No  . Sexual activity: Yes    Birth control/ protection: Pill   Other Topics Concern  . Not on file   Social History Narrative  . No narrative on file   The PMH, PSH, Social History, Family History, Medications, and allergies have been reviewed [facility], and  have been updated if relevant.  (O)  BP 110/70   Pulse 70   Temp 98.8 F (37.1 C)   Wt 117 lb 8 oz (53.3 kg)   SpO2 98%   BMI 17.87 kg/m   Physical exam reveals the patient appears well. Hydration status: well hydrated. Abdomen: abdomen is soft without significant tenderness, masses, organomegaly or guarding..  (A) Viral Gastroenteritis  (P) I have recommended small amounts clear fluids frequently, soups, juices, water and advance diet as tolerated. Return office visit if symptoms persist or worsen; I have alerted the patient to call if high fever, dehydration, marked weakness, fainting, increased abdominal pain, blood in stool or vomit.

## 2017-07-16 ENCOUNTER — Telehealth: Payer: Self-pay | Admitting: Obstetrics and Gynecology

## 2017-07-16 NOTE — Telephone Encounter (Signed)
Patient called and stated that she needs a new script of B.C. Her old script was $300.00 and the patient is done breast feeding. No other information was disclosed. Please advise.

## 2017-07-16 NOTE — Telephone Encounter (Signed)
Pt has been on loloestrin x 4 months. She has had spotting on/off.  Normal cycle started 07/15/2017. D/c BF 1 week ago. She went to get a new pack of ocp and it was 300.00. Offered pt a sample pack and coupon card but she prefers to be on her old b/c- Junel 1.5/30. Pt aware will need to ask MAD.

## 2017-07-17 MED ORDER — NORETHINDRONE ACET-ETHINYL EST 1.5-30 MG-MCG PO TABS: 1.0000 | ORAL_TABLET | Freq: Every day | ORAL | 1 refills | Status: DC

## 2017-07-17 NOTE — Telephone Encounter (Signed)
Mad ok with junel 1.5/30. Med er[facility] [address].

## 2017-07-26 DIAGNOSIS — L811 Chloasma: Secondary | ICD-10-CM | POA: Diagnosis not present

## 2017-07-26 DIAGNOSIS — L820 Inflamed seborrheic keratosis: Secondary | ICD-10-CM | POA: Diagnosis not present

## 2017-11-19 ENCOUNTER — Other Ambulatory Visit: Payer: Self-pay | Admitting: Obstetrics and Gynecology

## 2018-01-18 DIAGNOSIS — J020 Streptococcal pharyngitis: Secondary | ICD-10-CM | POA: Diagnosis not present

## 2018-03-18 ENCOUNTER — Other Ambulatory Visit: Payer: Self-pay | Admitting: Obstetrics and Gynecology

## 2018-05-19 ENCOUNTER — Other Ambulatory Visit: Payer: Self-pay | Admitting: Obstetrics and Gynecology

## 2018-05-26 ENCOUNTER — Other Ambulatory Visit: Payer: Self-pay | Admitting: Obstetrics and Gynecology

## 2018-08-11 ENCOUNTER — Telehealth: Payer: Self-pay

## 2018-08-11 ENCOUNTER — Telehealth: Payer: Self-pay | Admitting: Obstetrics and Gynecology

## 2018-08-11 NOTE — Telephone Encounter (Signed)
The patient is asking for a refill of her BCPs; however, she is not sure if she can get the refill without being seen first and is asking her nurse to contact her to discuss her options, please advise, thanks.

## 2018-08-11 NOTE — Telephone Encounter (Signed)
Pt was called and informed that she needed to make an appointment since her last visit was in May 2018. Pt was assisted with making an appointment to see MAD.

## 2018-08-26 NOTE — Progress Notes (Signed)
ANNUAL PREVENTATIVE CARE GYN  ENCOUNTER NOTE  Subjective:       Susan Hurst is a 37 y.o. [phone] female here for a routine annual gynecologic exam.  Current complaints: 1.  None  Menstrual cycles are regular on oral contraceptives.  No major interval health issues are identified. Bowel function is normal. Bladder function is normal. Patient is uncertain as to if she will have future children (husband says they are done).   Gynecologic History No LMP recorded. (Menstrual status: Lactating). Contraception: oral progesterone-only contraceptive Last Pap: 01/2017 neg. Results were: normal Last mammogram: n/a.  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     Past Medical History:  Diagnosis Date  . Asthma    hx of- no meds in years  . Microscopic hematuria 11/10/2010   Qualifier: Diagnosis of  By: Dayton MartesAron MD, Jovita Gammaalia    . Thyroid cyst   . THYROID CYST 07/24/2010   Qualifier: Diagnosis of  By: Dayton MartesAron MD, Talia      Past Surgical History:  Procedure Laterality Date  . WISDOM TOOTH EXTRACTION  2001    Current Outpatient Medications on File Prior to Visit  Medication Sig Dispense Refill  . JUNEL 1.5/30 1.5-30 MG-MCG tablet TAKE 1 TABLET BY MOUTH EVERY DAY 63 tablet 0  . Prenatal Vit-Fe Fumarate-FA (MULTIVITAMIN-PRENATAL) 27-0.8 MG TABS tablet Take 1 tablet by mouth daily at 12 noon. Reported on 11/22/2015     No current facility-administered medications on file prior to visit.     Allergies   Allergen Reactions  . Penicillins Hives and Rash    Social History   Socioeconomic History  . Marital status: Married    Spouse name: Not on file  . Number of children: Not on file  . Years of education: Not on file  . Highest education level: Not on file  Occupational History  . Occupation: Doctor, general practicepeech pathologist    Employer: [employer]  Social Needs  . Financial resource strain: Not on file  . Food insecurity:    Worry: Not on file    Inability: Not on file  . Transportation needs:    Medical: Not on file    Non-medical: Not on file  Tobacco Use  . Smoking status: Never Smoker  . Smokeless tobacco: Never Used  Substance and Sexual Activity  . Alcohol use: No  . Drug use: No  . Sexual activity: Yes    Birth control/protection: Pill  Lifestyle  . Physical activity:    Days per week: Not on file    Minutes per session: Not on file  . Stress: Not on file  Relationships  . Social connections:    Talks on phone: Not on file    Gets together: Not on file    Attends religious service: Not on file  Active member of club or organization: Not on file    Attends meetings of clubs or organizations: Not on file    Relationship status: Not on file  . Intimate partner violence:    Fear of current or ex partner: Not on file    Emotionally abused: Not on file    Physically abused: Not on file    Forced sexual activity: Not on file  Other Topics Concern  . Not on file  Social History Narrative  . Not on file    Family History  Problem Relation Age of Onset  . Hyperlipidemia Father   . Hypertension Father   . Polycystic ovary syndrome Sister   . Hypothyroidism Sister   . Heart failure Maternal Uncle   . Stomach cancer Paternal Grandfather   . Cancer Neg Hx   . Heart disease Neg Hx     The following portions of the patient's history were reviewed and updated as appropriate: allergies, current medications, past family history, past medical history, past social history,  past surgical history and problem list.  Review of Systems Review of Systems  Constitutional: Negative.   Respiratory: Negative.   Cardiovascular: Negative.   Gastrointestinal: Negative.   Genitourinary: Negative.   Musculoskeletal: Negative.   Skin: Negative.   All other systems reviewed and are negative.     Objective:   BP 129/89   Pulse 63   Ht 5' 8.5" (1.74 m)   Wt 131 lb (59.4 kg)   LMP 08/07/2018   BMI 19.63 kg/m  CONSTITUTIONAL: Well-developed, well-nourished female in no acute distress.  PSYCHIATRIC: Normal mood and affect. Normal behavior. Normal judgment and thought content. NEUROLGIC: Alert and oriented to person, place, and time. Normal muscle tone coordination. No cranial nerve deficit noted. HENT:  Normocephalic, atraumatic, External right and left ear normal. EYES: Conjunctivae and EOM are normal. No scleral icterus.  NECK: Normal range of motion, supple, no masses.  Normal thyroid.  SKIN: Skin is warm and dry. No rash noted. Not diaphoretic. No erythema. No pallor. CARDIOVASCULAR: Normal heart rate noted, regular rhythm, no murmur. RESPIRATORY: Clear to auscultation bilaterally. Effort and breath sounds normal, no problems with respiration noted. BREASTS: Symmetric in size. No masses, skin changes, nipple drainage, or lymphadenopathy. Lactation changes present ABDOMEN: Soft, no distention noted.  No tenderness, rebound or guarding.  BLADDER: Normal PELVIC:  External Genitalia: Normal  BUS: Normal  Vagina: Normal; fair estrogen effect  Cervix: Normal; no cervical motion tenderness; no lesions  Uterus: small and midplane, mobile, nontender  Adnexa: Normal; nonpalpable nontender  RV: external exam normal MUSCULOSKELETAL: Normal range of motion. No tenderness.  No cyanosis, clubbing, or edema.  2+ distal pulses. LYMPHATIC: No Axillary, Supraclavicular, or Inguinal Adenopathy.    Assessment:   Annual gynecologic examination 37 y.o. Contraception: oral  progesterone-only contraceptive bmi-19   Plan:  Pap: Due 2021 Mammogram: Not Indicated Stool Guaiac Testing:  Not Indicated Labs: vit d tsh lipid fbs a1c Routine preventative health maintenance measures emphasized: Exercise/Diet/Weight Control; counseled on alcohol/substance abuse, stress management Refill Janel 1.5/30 Return to Clinic - 1 Year-Dr. Albin Fischer, CMA   Britt Bottom CMA acting as scribe for Dr. Greggory Keen. I reviewed, updated, and concur with information scribed. Herold Harms, MD  Note: This dictation was prepared with Dragon dictation along with smaller phrase technology. Any transcriptional errors that result from this process are unintentional.

## 2018-08-28 ENCOUNTER — Ambulatory Visit (INDEPENDENT_AMBULATORY_CARE_PROVIDER_SITE_OTHER): Payer: BLUE CROSS/BLUE SHIELD | Admitting: Obstetrics and Gynecology

## 2018-08-28 ENCOUNTER — Encounter: Payer: Self-pay | Admitting: Obstetrics and Gynecology

## 2018-08-28 VITALS — BP 129/89 | HR 63 | Ht 68.5 in | Wt 131.0 lb

## 2018-08-28 DIAGNOSIS — Z3041 Encounter for surveillance of contraceptive pills: Secondary | ICD-10-CM

## 2018-08-28 DIAGNOSIS — Z01419 Encounter for gynecological examination (general) (routine) without abnormal findings: Secondary | ICD-10-CM | POA: Diagnosis not present

## 2018-08-28 MED ORDER — NORETHINDRONE ACET-ETHINYL EST 1.5-30 MG-MCG PO TABS: 1.0000 | ORAL_TABLET | Freq: Every day | ORAL | 2 refills | Status: DC

## 2018-08-28 NOTE — Patient Instructions (Signed)
1.  No Pap smear is done.  Next Pap smear is due 2021. 2.  Self breast awareness is encouraged. 3.  Screening labs are to be obtained in the near future.  They are ordered today. 4.  Continue with healthy eating and exercise. 5.  Recommend multivitamin daily 6.  Junel 1.5/30 is refilled for 1 year 7.  Return in 1 year for annual exam-Dr. [place] Maintenance, Female Adopting a healthy lifestyle and getting preventive care can go a long way to promote health and wellness. Talk with your health care provider about what schedule of regular examinations is right for you. This is a good chance for you to check in with your provider about disease prevention and staying healthy. In between checkups, there are plenty of things you can do on your own. Experts have done a lot of research about which lifestyle changes and preventive measures are most likely to keep you healthy. Ask your health care provider for more information. Weight and diet Eat a healthy diet  Be sure to include plenty of vegetables, fruits, low-fat dairy products, and lean protein.  Do not eat a lot of foods high in solid fats, added sugars, or salt.  Get regular exercise. This is one of the most important things you can do for your health. ? Most adults should exercise for at least 150 minutes each week. The exercise should increase your heart rate and make you sweat (moderate-intensity exercise). ? Most adults should also do strengthening exercises at least twice a week. This is in addition to the moderate-intensity exercise.  Maintain a healthy weight  Body mass index (BMI) is a measurement that can be used to identify possible weight problems. It estimates body fat based on height and weight. Your health care provider can help determine your BMI and help you achieve or maintain a healthy weight.  For females 64 years of age and older: ? A BMI below 18.5 is considered underweight. ? A BMI of 18.5 to 24.9 is  normal. ? A BMI of 25 to 29.9 is considered overweight. ? A BMI of 30 and above is considered obese.  Watch levels of cholesterol and blood lipids  You should start having your blood tested for lipids and cholesterol at 36 years of age, then have this test every 5 years.  You may need to have your cholesterol levels checked more often if: ? Your lipid or cholesterol levels are high. ? You are older than 36 years of age. ? You are at high risk for heart disease.  Cancer screening Lung Cancer  Lung cancer screening is recommended for adults 66-20 years old who are at high risk for lung cancer because of a history of smoking.  A yearly low-dose CT scan of the lungs is recommended for people who: ? Currently smoke. ? Have quit within the past 15 years. ? Have at least a 30-pack-year history of smoking. A pack year is smoking an average of one pack of cigarettes a day for 1 year.  Yearly screening should continue until it has been 15 years since you quit.  Yearly screening should stop if you develop a health problem that would prevent you from having lung cancer treatment.  Breast Cancer  Practice breast self-awareness. This means understanding how your breasts normally appear and feel.  It also means doing regular breast self-exams. Let your health care provider know about any changes, no matter how small.  If you are in your 54s or  44s, you should have a clinical breast exam (CBE) by a health care provider every 1-3 years as part of a regular health exam.  If you are 6 or older, have a CBE every year. Also consider having a breast X-ray (mammogram) every year.  If you have a family history of breast cancer, talk to your health care provider about genetic screening.  If you are at high risk for breast cancer, talk to your health care provider about having an MRI and a mammogram every year.  Breast cancer gene (BRCA) assessment is recommended for women who have family members  with BRCA-related cancers. BRCA-related cancers include: ? Breast. ? Ovarian. ? Tubal. ? Peritoneal cancers.  Results of the assessment will determine the need for genetic counseling and BRCA1 and BRCA2 testing.  Cervical Cancer Your health care provider may recommend that you be screened regularly for cancer of the pelvic organs (ovaries, uterus, and vagina). This screening involves a pelvic examination, including checking for microscopic changes to the surface of your cervix (Pap test). You may be encouraged to have this screening done every 3 years, beginning at age 19.  For women ages 44-65, health care providers may recommend pelvic exams and Pap testing every 3 years, or they may recommend the Pap and pelvic exam, combined with testing for human papilloma virus (HPV), every 5 years. Some types of HPV increase your risk of cervical cancer. Testing for HPV may also be done on women of any age with unclear Pap test results.  Other health care providers may not recommend any screening for nonpregnant women who are considered low risk for pelvic cancer and who do not have symptoms. Ask your health care provider if a screening pelvic exam is right for you.  If you have had past treatment for cervical cancer or a condition that could lead to cancer, you need Pap tests and screening for cancer for at least 20 years after your treatment. If Pap tests have been discontinued, your risk factors (such as having a new sexual partner) need to be reassessed to determine if screening should resume. Some women have medical problems that increase the chance of getting cervical cancer. In these cases, your health care provider may recommend more frequent screening and Pap tests.  Colorectal Cancer  This type of cancer can be detected and often prevented.  Routine colorectal cancer screening usually begins at 36 years of age and continues through 36 years of age.  Your health care provider may recommend  screening at an earlier age if you have risk factors for colon cancer.  Your health care provider may also recommend using home test kits to check for hidden blood in the stool.  A small camera at the end of a tube can be used to examine your colon directly (sigmoidoscopy or colonoscopy). This is done to check for the earliest forms of colorectal cancer.  Routine screening usually begins at age 24.  Direct examination of the colon should be repeated every 5-10 years through 36 years of age. However, you may need to be screened more often if early forms of precancerous polyps or small growths are found.  Skin Cancer  Check your skin from head to toe regularly.  Tell your health care provider about any new moles or changes in moles, especially if there is a change in a mole's shape or color.  Also tell your health care provider if you have a mole that is larger than the size of a pencil  eraser.  Always use sunscreen. Apply sunscreen liberally and repeatedly throughout the day.  Protect yourself by wearing long sleeves, pants, a wide-brimmed hat, and sunglasses whenever you are outside.  Heart disease, diabetes, and high blood pressure  High blood pressure causes heart disease and increases the risk of stroke. High blood pressure is more likely to develop in: ? People who have blood pressure in the high end of the normal range (130-139/85-89 mm Hg). ? People who are overweight or obese. ? People who are African American.  If you are 51-23 years of age, have your blood pressure checked every 3-5 years. If you are 21 years of age or older, have your blood pressure checked every year. You should have your blood pressure measured twice-once when you are at a hospital or clinic, and once when you are not at a hospital or clinic. Record the average of the two measurements. To check your blood pressure when you are not at a hospital or clinic, you can use: ? An automated blood pressure machine at  a pharmacy. ? A home blood pressure monitor.  If you are between 62 years and 40 years old, ask your health care provider if you should take aspirin to prevent strokes.  Have regular diabetes screenings. This involves taking a blood sample to check your fasting blood sugar level. ? If you are at a normal weight and have a low risk for diabetes, have this test once every three years after 36 years of age. ? If you are overweight and have a high risk for diabetes, consider being tested at a younger age or more often. Preventing infection Hepatitis B  If you have a higher risk for hepatitis B, you should be screened for this virus. You are considered at high risk for hepatitis B if: ? You were born in a country where hepatitis B is common. Ask your health care provider which countries are considered high risk. ? Your parents were born in a high-risk country, and you have not been immunized against hepatitis B (hepatitis B vaccine). ? You have HIV or AIDS. ? You use needles to inject street drugs. ? You live with someone who has hepatitis B. ? You have had sex with someone who has hepatitis B. ? You get hemodialysis treatment. ? You take certain medicines for conditions, including cancer, organ transplantation, and autoimmune conditions.  Hepatitis C  Blood testing is recommended for: ? Everyone born from [birth year] through 1964. ? Anyone with known risk factors for hepatitis C.  Sexually transmitted infections (STIs)  You should be screened for sexually transmitted infections (STIs) including gonorrhea and chlamydia if: ? You are sexually active and are younger than 36 years of age. ? You are older than 36 years of age and your health care provider tells you that you are at risk for this type of infection. ? Your sexual activity has changed since you were last screened and you are at an increased risk for chlamydia or gonorrhea. Ask your health care provider if you are at risk.  If you do  not have HIV, but are at risk, it may be recommended that you take a prescription medicine daily to prevent HIV infection. This is called pre-exposure prophylaxis (PrEP). You are considered at risk if: ? You are sexually active and do not regularly use condoms or know the HIV status of your partner(s). ? You take drugs by injection. ? You are sexually active with a partner who has HIV.  Talk with your health care provider about whether you are at high risk of being infected with HIV. If you choose to begin PrEP, you should first be tested for HIV. You should then be tested every 3 months for as long as you are taking PrEP. Pregnancy  If you are premenopausal and you may become pregnant, ask your health care provider about preconception counseling.  If you may become pregnant, take 400 to 800 micrograms (mcg) of folic acid every day.  If you want to prevent pregnancy, talk to your health care provider about birth control (contraception). Osteoporosis and menopause  Osteoporosis is a disease in which the bones lose minerals and strength with aging. This can result in serious bone fractures. Your risk for osteoporosis can be identified using a bone density scan.  If you are 8 years of age or older, or if you are at risk for osteoporosis and fractures, ask your health care provider if you should be screened.  Ask your health care provider whether you should take a calcium or vitamin D supplement to lower your risk for osteoporosis.  Menopause may have certain physical symptoms and risks.  Hormone replacement therapy may reduce some of these symptoms and risks. Talk to your health care provider about whether hormone replacement therapy is right for you. Follow these instructions at home:  Schedule regular health, dental, and eye exams.  Stay current with your immunizations.  Do not use any tobacco products including cigarettes, chewing tobacco, or electronic cigarettes.  If you are  pregnant, do not drink alcohol.  If you are breastfeeding, limit how much and how often you drink alcohol.  Limit alcohol intake to no more than 1 drink per day for nonpregnant women. One drink equals 12 ounces of beer, 5 ounces of wine, or 1 ounces of hard liquor.  Do not use street drugs.  Do not share needles.  Ask your health care provider for help if you need support or information about quitting drugs.  Tell your health care provider if you often feel depressed.  Tell your health care provider if you have ever been abused or do not feel safe at home. This information is not intended to replace advice given to you by your health care provider. Make sure you discuss any questions you have with your health care provider. Document Released: 03/26/2011 Document Revised: 02/16/2016 Document Reviewed: 06/14/2015 Elsevier Interactive Patient Education  [employer].

## 2018-09-02 ENCOUNTER — Other Ambulatory Visit: Payer: BLUE CROSS/BLUE SHIELD

## 2018-09-02 DIAGNOSIS — Z13220 Encounter for screening for lipoid disorders: Secondary | ICD-10-CM | POA: Diagnosis not present

## 2018-09-02 DIAGNOSIS — Z01419 Encounter for gynecological examination (general) (routine) without abnormal findings: Secondary | ICD-10-CM | POA: Diagnosis not present

## 2018-09-03 LAB — HEMOGLOBIN A1C
ESTIMATED AVERAGE GLUCOSE: 111 mg/dL
HEMOGLOBIN A1C: 5.5 % (ref 4.8–5.6)

## 2018-09-03 LAB — LIPID PANEL
CHOL/HDL RATIO: 4.2 ratio (ref 0.0–4.4)
Cholesterol, Total: 202 mg/dL — ABNORMAL HIGH (ref 100–199)
HDL: 48 mg/dL (ref >39)
LDL CALC: 137 mg/dL — AB (ref 0–99)
Triglycerides: 87 mg/dL (ref 0–149)
VLDL Cholesterol Cal: 17 mg/dL (ref 5–40)

## 2018-09-03 LAB — TSH: TSH: 0.868 u[IU]/mL (ref 0.450–4.500)

## 2018-09-03 LAB — GLUCOSE, RANDOM: Glucose: 81 mg/dL (ref 65–99)

## 2018-10-02 DIAGNOSIS — Z23 Encounter for immunization: Secondary | ICD-10-CM | POA: Diagnosis not present

## 2018-10-15 DIAGNOSIS — R509 Fever, unspecified: Secondary | ICD-10-CM | POA: Diagnosis not present

## 2018-10-15 DIAGNOSIS — J101 Influenza due to other identified influenza virus with other respiratory manifestations: Secondary | ICD-10-CM | POA: Diagnosis not present

## 2018-10-20 NOTE — Telephone Encounter (Signed)
1 

## 2019-05-14 ENCOUNTER — Telehealth: Payer: Self-pay | Admitting: Obstetrics and Gynecology

## 2019-05-14 NOTE — Telephone Encounter (Signed)
Patients pharmacy told her she does not have any remaining refills on birth control. The patient is not due for annual until December which she is scheduled for with Dr Marcelline Mates. She is requesting a refill [facility] to [facility] [address]. Thanks

## 2019-05-20 MED ORDER — NORETHINDRONE ACET-ETHINYL EST 1.5-30 MG-MCG PO TABS: 1.0000 | ORAL_TABLET | Freq: Every day | ORAL | 2 refills | Status: DC

## 2019-05-20 NOTE — Telephone Encounter (Signed)
Pt called no answer LM via VM informing pt that  her medication has been refilled and sent to her pharmacy.

## 2019-08-26 ENCOUNTER — Other Ambulatory Visit: Payer: Self-pay

## 2019-08-26 DIAGNOSIS — Z20822 Contact with and (suspected) exposure to covid-19: Secondary | ICD-10-CM

## 2019-08-29 LAB — NOVEL CORONAVIRUS, NAA: SARS-CoV-2, NAA: NOT DETECTED

## 2019-08-31 ENCOUNTER — Telehealth: Payer: Self-pay | Admitting: Obstetrics and Gynecology

## 2019-08-31 ENCOUNTER — Telehealth: Payer: Self-pay

## 2019-08-31 DIAGNOSIS — Z8616 Personal history of COVID-19: Secondary | ICD-10-CM

## 2019-08-31 HISTORY — DX: Personal history of COVID-19: Z86.16

## 2019-08-31 NOTE — Telephone Encounter (Signed)
Pt was called for pre-screen she is wondering if she should recsh. Her appt. Please advise

## 2019-08-31 NOTE — Telephone Encounter (Signed)
Pt can keep her appointment.

## 2019-08-31 NOTE — Telephone Encounter (Signed)
Pt has physical appt with Cherry on 09/02/19. Pt has Sore throat and runny nose for couple of days. Spouse tested positive COVD-19 on 08/20/19 and his 14 days will be up 11/02/18. pt tested neg 08/26/19. Does pt need to r/w appt 09/02/19.

## 2019-09-02 ENCOUNTER — Encounter: Payer: BLUE CROSS/BLUE SHIELD | Admitting: Obstetrics and Gynecology

## 2019-09-02 ENCOUNTER — Encounter: Payer: Self-pay | Admitting: Obstetrics and Gynecology

## 2019-09-02 DIAGNOSIS — Z20828 Contact with and (suspected) exposure to other viral communicable diseases: Secondary | ICD-10-CM | POA: Diagnosis not present

## 2019-09-02 DIAGNOSIS — J069 Acute upper respiratory infection, unspecified: Secondary | ICD-10-CM | POA: Diagnosis not present

## 2019-09-02 DIAGNOSIS — R432 Parageusia: Secondary | ICD-10-CM | POA: Diagnosis not present

## 2019-10-05 NOTE — Telephone Encounter (Signed)
Error

## 2019-11-17 ENCOUNTER — Other Ambulatory Visit: Payer: Self-pay | Admitting: Obstetrics and Gynecology

## 2019-12-10 ENCOUNTER — Other Ambulatory Visit (HOSPITAL_COMMUNITY)
Admission: RE | Admit: 2019-12-10 | Discharge: 2019-12-10 | Disposition: A | Discharge status: Home / Self Care | Payer: BC Managed Care – PPO | Source: Ambulatory Visit | Attending: Obstetrics and Gynecology | Admitting: Obstetrics and Gynecology

## 2019-12-10 ENCOUNTER — Other Ambulatory Visit: Payer: Self-pay

## 2019-12-10 ENCOUNTER — Ambulatory Visit (INDEPENDENT_AMBULATORY_CARE_PROVIDER_SITE_OTHER): Payer: BC Managed Care – PPO | Admitting: Obstetrics and Gynecology

## 2019-12-10 ENCOUNTER — Encounter: Payer: Self-pay | Admitting: Obstetrics and Gynecology

## 2019-12-10 VITALS — BP 123/74 | HR 61 | Ht 68.5 in | Wt 130.4 lb

## 2019-12-10 DIAGNOSIS — Z841 Family history of disorders of kidney and ureter: Secondary | ICD-10-CM

## 2019-12-10 DIAGNOSIS — E041 Nontoxic single thyroid nodule: Secondary | ICD-10-CM | POA: Diagnosis not present

## 2019-12-10 DIAGNOSIS — Z01419 Encounter for gynecological examination (general) (routine) without abnormal findings: Secondary | ICD-10-CM

## 2019-12-10 DIAGNOSIS — Z124 Encounter for screening for malignant neoplasm of cervix: Secondary | ICD-10-CM | POA: Diagnosis not present

## 2019-12-10 DIAGNOSIS — Z3041 Encounter for surveillance of contraceptive pills: Secondary | ICD-10-CM

## 2019-12-10 DIAGNOSIS — Z13220 Encounter for screening for lipoid disorders: Secondary | ICD-10-CM

## 2019-12-10 DIAGNOSIS — K5900 Constipation, unspecified: Secondary | ICD-10-CM

## 2019-12-10 DIAGNOSIS — Z8616 Personal history of COVID-19: Secondary | ICD-10-CM

## 2019-12-10 MED ORDER — NORETHINDRONE ACET-ETHINYL EST 1.5-30 MG-MCG PO TABS: 1.0000 | ORAL_TABLET | Freq: Every day | ORAL | 3 refills | Status: DC

## 2019-12-10 MED ORDER — NORETHINDRONE ACET-ETHINYL EST 1.5-30 MG-MCG PO TABS: 1.0000 | ORAL_TABLET | Freq: Every day | ORAL | 11 refills | Status: DC

## 2019-12-10 NOTE — Patient Instructions (Addendum)
Preventive Care 21-39 Years Old, Female Preventive care refers to visits with your health care provider and lifestyle choices that can promote health and wellness. This includes:  A yearly physical exam. This may also be called an annual well check.  Regular dental visits and eye exams.  Immunizations.  Screening for certain conditions.  Healthy lifestyle choices, such as eating a healthy diet, getting regular exercise, not using drugs or products that contain nicotine and tobacco, and limiting alcohol use. What can I expect for my preventive care visit? Physical exam Your health care provider will check your:  Height and weight. This may be used to calculate body mass index (BMI), which tells if you are at a healthy weight.  Heart rate and blood pressure.  Skin for abnormal spots. Counseling Your health care provider may ask you questions about your:  Alcohol, tobacco, and drug use.  Emotional well-being.  Home and relationship well-being.  Sexual activity.  Eating habits.  Work and work environment.  Method of birth control.  Menstrual cycle.  Pregnancy history. What immunizations do I need?  Influenza (flu) vaccine  This is recommended every year. Tetanus, diphtheria, and pertussis (Tdap) vaccine  You may need a Td booster every 10 years. Varicella (chickenpox) vaccine  You may need this if you have not been vaccinated. Human papillomavirus (HPV) vaccine  If recommended by your health care provider, you may need three doses over 6 months. Measles, mumps, and rubella (MMR) vaccine  You may need at least one dose of MMR. You may also need a second dose. Meningococcal conjugate (MenACWY) vaccine  One dose is recommended if you are age 19-21 years and a first-year college student living in a residence hall, or if you have one of several medical conditions. You may also need additional booster doses. Pneumococcal conjugate (PCV13) vaccine  You may need  this if you have certain conditions and were not previously vaccinated. Pneumococcal polysaccharide (PPSV23) vaccine  You may need one or two doses if you smoke cigarettes or if you have certain conditions. Hepatitis A vaccine  You may need this if you have certain conditions or if you travel or work in places where you may be exposed to hepatitis A. Hepatitis B vaccine  You may need this if you have certain conditions or if you travel or work in places where you may be exposed to hepatitis B. Haemophilus influenzae type b (Hib) vaccine  You may need this if you have certain conditions. You may receive vaccines as individual doses or as more than one vaccine together in one shot (combination vaccines). Talk with your health care provider about the risks and benefits of combination vaccines. What tests do I need?  Blood tests  Lipid and cholesterol levels. These may be checked every 5 years starting at age 20.  Hepatitis C test.  Hepatitis B test. Screening  Diabetes screening. This is done by checking your blood sugar (glucose) after you have not eaten for a while (fasting).  Sexually transmitted disease (STD) testing.  BRCA-related cancer screening. This may be done if you have a family history of breast, ovarian, tubal, or peritoneal cancers.  Pelvic exam and Pap test. This may be done every 3 years starting at age 21. Starting at age 30, this may be done every 5 years if you have a Pap test in combination with an HPV test. Talk with your health care provider about your test results, treatment options, and if necessary, the need for more tests.   Follow these instructions at home: Eating and drinking   Eat a diet that includes fresh fruits and vegetables, whole grains, lean protein, and low-fat dairy.  Take vitamin and mineral supplements as recommended by your health care provider.  Do not drink alcohol if: ? Your health care provider tells you not to drink. ? You are  pregnant, may be pregnant, or are planning to become pregnant.  If you drink alcohol: ? Limit how much you have to 0-1 drink a day. ? Be aware of how much alcohol is in your drink. In the [place] one drink equals one 12 oz bottle of beer (355 mL), one 5 oz glass of wine (148 mL), or one 1 oz glass of hard liquor (44 mL). Lifestyle  Take daily care of your teeth and gums.  Stay active. Exercise for at least 30 minutes on 5 or more days each week.  Do not use any products that contain nicotine or tobacco, such as cigarettes, e-cigarettes, and chewing tobacco. If you need help quitting, ask your health care provider.  If you are sexually active, practice safe sex. Use a condom or other form of birth control (contraception) in order to prevent pregnancy and STIs (sexually transmitted infections). If you plan to become pregnant, see your health care provider for a preconception visit. What's next?  Visit your health care provider once a year for a well check visit.  Ask your health care provider how often you should have your eyes and teeth checked.  Stay up to date on all vaccines. This information is not intended to replace advice given to you by your health care provider. Make sure you discuss any questions you have with your health care provider. Document Revised: 05/22/2018 Document Reviewed: 05/22/2018 Elsevier Patient Education  2020 Elsevier Inc. Breast Self-Awareness Breast self-awareness is knowing how your breasts look and feel. Doing breast self-awareness is important. It allows you to catch a breast problem early while it is still small and can be treated. All women should do breast self-awareness, including women who have had breast implants. Tell your doctor if you notice a change in your breasts. What you need:  A mirror.  A well-lit room. How to do a breast self-exam A breast self-exam is one way to learn what is normal for your breasts and to check for changes. To do a  breast self-exam: Look for changes  1. Take off all the clothes above your waist. 2. Stand in front of a mirror in a room with good lighting. 3. Put your hands on your hips. 4. Push your hands down. 5. Look at your breasts and nipples in the mirror to see if one breast or nipple looks different from the other. Check to see if: ? The shape of one breast is different. ? The size of one breast is different. ? There are wrinkles, dips, and bumps in one breast and not the other. 6. Look at each breast for changes in the skin, such as: ? Redness. ? Scaly areas. 7. Look for changes in your nipples, such as: ? Liquid around the nipples. ? Bleeding. ? Dimpling. ? Redness. ? A change in where the nipples are. Feel for changes  1. Lie on your back on the floor. 2. Feel each breast. To do this, follow these steps: ? Pick a breast to feel. ? Put the arm closest to that breast above your head. ? Use your other arm to feel the nipple area of your breast. Feel   the area with the pads of your three middle fingers by making small circles with your fingers. For the first circle, press lightly. For the second circle, press harder. For the third circle, press even harder. ? Keep making circles with your fingers at the different pressures as you move down your breast. Stop when you feel your ribs. ? Move your fingers a little toward the center of your body. ? Start making circles with your fingers again, this time going up until you reach your collarbone. ? Keep making up-and-down circles until you reach your armpit. Remember to keep using the three pressures. ? Feel the other breast in the same way. 3. Sit or stand in the tub or shower. 4. With soapy water on your skin, feel each breast the same way you did in step 2 when you were lying on the floor. Write down what you find Writing down what you find can help you remember what to tell your doctor. Write down:  What is normal for each breast.  Any  changes you find in each breast, including: ? The kind of changes you find. ? Whether you have pain. ? Size and location of any lumps.  When you last had your menstrual period. General tips  Check your breasts every month.  If you are breastfeeding, the best time to check your breasts is after you feed your baby or after you use a breast pump.  If you get menstrual periods, the best time to check your breasts is 5-7 days after your menstrual period is over.  With time, you will become comfortable with the self-exam, and you will begin to know if there are changes in your breasts. Contact a doctor if you:  See a change in the shape or size of your breasts or nipples.  See a change in the skin of your breast or nipples, such as red or scaly skin.  Have fluid coming from your nipples that is not normal.  Find a lump or thick area that was not there before.  Have pain in your breasts.  Have any concerns about your breast health. Summary  Breast self-awareness includes looking for changes in your breasts, as well as feeling for changes within your breasts.  Breast self-awareness should be done in front of a mirror in a well-lit room.  You should check your breasts every month. If you get menstrual periods, the best time to check your breasts is 5-7 days after your menstrual period is over.  Let your doctor know of any changes you see in your breasts, including changes in size, changes on the skin, pain or tenderness, or fluid from your nipples that is not normal. This information is not intended to replace advice given to you by your health care provider. Make sure you discuss any questions you have with your health care provider. Document Revised: 04/29/2018 Document Reviewed: 04/29/2018 Elsevier Patient Education  2020 Elsevier Inc.  

## 2019-12-10 NOTE — Progress Notes (Signed)
GYNECOLOGY ANNUAL PHYSICAL EXAM PROGRESS NOTE  Subjective:    Susan Hurst is a 39 y.o. [phone] married female who presents for an annual exam. The patient has no complaints today. The patient is sexually active.  The patient wears seatbelts: yes. The patient participates in regular exercise: no. Has the patient ever been transfused or tattooed?: no. The patient reports that there is not domestic violence in her life.    Patient reports a concern regarding her kidneys.  Notes that her mother had kidney issues (notes an issues with swelling and only having 1 kidney), as well as her oldest daughter who had kidney issues from birth. Patient also reports that her sister has recently undergone stent placement for UPJ obstruction, had significant pain and other issues with her kidneys.   She also reports a history of COVID-19 infection in December 2020, also the reason for the delay in returning for her annual exam.   Gynecologic History Patient's last menstrual period was 11/24/2019. Contraception: OCP (estrogen/progesterone). Needs refill.  History of STI's: Denies Last Pap: 01/29/2017. Results were: normal.  Denies h/o abnormal pap smears.   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     Past Medical History:  Diagnosis Date  . Asthma    hx of- no meds in years  . Microscopic hematuria 11/10/2010   Qualifier: Diagnosis of  By: 11/12/2010 MD, Dayton Martes    . Thyroid cyst   . THYROID CYST 07/24/2010   Qualifier: Diagnosis of  By: 07/26/2010 MD, Talia      Past Surgical History:  Procedure Laterality Date  . WISDOM TOOTH EXTRACTION  2001    Family History  Problem Relation Age of Onset  . Hyperlipidemia Father   . Hypertension Father   . Polycystic ovary syndrome Sister   . Hypothyroidism Sister   . Hydrocephalus Sister   . Cancer Maternal Uncle   . Stomach cancer Paternal Grandfather   . Heart disease Neg Hx     Social History   Socioeconomic History  . Marital status: Married    Spouse name: Not on file  . Number of children: Not on file  . Years of education: Not on file  . Highest education level: Not on file  Occupational History  . Occupation: 2002    Employer: [employer]  Tobacco Use  . Smoking status: Never Smoker  . Smokeless tobacco: Never Used  Substance and Sexual Activity  . Alcohol use: Yes    Comment: occcas  . Drug use: No  . Sexual activity: Yes    Birth control/protection: Pill  Other Topics Concern  . Not on  file  Social History Narrative  . Not on file   Social Determinants of Health   Financial Resource Strain:   . Difficulty of Paying Living Expenses:   Food Insecurity:   . Worried About Charity fundraiser in the Last Year:   . Arboriculturist in the Last Year:   Transportation Needs:   . Film/video editor (Medical):   Marland Kitchen Lack of Transportation (Non-Medical):   Physical Activity:   . Days of Exercise per Week:   . Minutes of Exercise per Session:   Stress:   . Feeling of Stress :   Social Connections:   . Frequency of Communication with Friends and Family:   . Frequency of Social Gatherings with Friends and Family:   . Attends Religious Services:   . Active Member of Clubs or Organizations:   . Attends Archivist Meetings:   Marland Kitchen Marital Status:   Intimate Partner Violence:   . Fear of Current or Ex-Partner:   .  Emotionally Abused:   Marland Kitchen Physically Abused:   . Sexually Abused:     Current Outpatient Medications on File Prior to Visit  Medication Sig Dispense Refill  . JUNEL 1.5/30 1.5-30 MG-MCG tablet TAKE 1 TABLET BY MOUTH EVERY DAY 28 tablet 1   No current facility-administered medications on file prior to visit.    Allergies  Allergen Reactions  . Penicillins Hives and Rash      Review of Systems Constitutional: negative for chills, fatigue, fevers and sweats Eyes: negative for irritation, redness and visual disturbance Ears, nose, mouth, throat, and face: negative for hearing loss, nasal congestion, snoring and tinnitus Respiratory: negative for asthma, cough, sputum Cardiovascular: negative for chest pain, dyspnea, exertional chest pressure/discomfort, irregular heart beat, palpitations and syncope Gastrointestinal: negative for abdominal pain, nausea and vomiting. Positive for constipation Genitourinary: negative for abnormal menstrual periods, genital lesions, sexual problems and vaginal discharge, dysuria and urinary incontinence Integument/breast: negative for breast lump, breast tenderness and nipple discharge Hematologic/lymphatic: negative for bleeding and easy bruising Musculoskeletal:negative for back pain and muscle weakness Neurological: negative for dizziness, headaches, vertigo and weakness Endocrine: negative for diabetic symptoms including polydipsia, polyuria and skin dryness Allergic/Immunologic: negative for hay fever and urticaria        Objective:  Blood pressure 123/74, pulse 61, height 5' 8.5" (1.74 m), weight 130 lb 6.4 oz (59.1 kg), last menstrual period 11/24/2019, currently breastfeeding. Body mass index is 19.54 kg/m.  General Appearance:    Alert, cooperative, no distress, appears stated age  Head:    Normocephalic, without obvious abnormality, atraumatic  Eyes:    PERRL, conjunctiva/corneas clear, EOM's intact, both eyes  Ears:    Normal external ear  canals, both ears  Nose:   Nares normal, septum midline, mucosa normal, no drainage or sinus tenderness  Throat:   Lips, mucosa, and tongue normal; teeth and gums normal  Neck:   Supple, symmetrical, trachea midline, no adenopathy; thyroid: no enlargement/tenderness/nodules; no carotid bruit or JVD  Back:     Symmetric, no curvature, ROM normal, no CVA tenderness  Lungs:     Clear to auscultation bilaterally, respirations unlabored  Chest Wall:    No tenderness or deformity   Heart:    Regular rate and rhythm, S1 and S2 normal, no murmur, rub or gallop  Breast Exam:    No tenderness, masses, or nipple abnormality  Abdomen:     Soft, non-tender, bowel sounds active all four quadrants, no masses, no organomegaly.  Genitalia:    Pelvic:external genitalia normal, vagina without lesions, discharge, or tenderness, rectovaginal septum  normal. Cervix normal in appearance, no cervical motion tenderness, no adnexal masses or tenderness.  Uterus normal size, shape, mobile, regular contours, nontender.  Rectal:    Normal external sphincter.  No hemorrhoids appreciated. Internal exam not done.   Extremities:   Extremities normal, atraumatic, no cyanosis or edema  Pulses:   2+ and symmetric all extremities  Skin:   Skin color, texture, turgor normal, no rashes or lesions  Lymph nodes:   Cervical, supraclavicular, and axillary nodes normal  Neurologic:   CNII-XII intact, normal strength, sensation and reflexes throughout   .  Labs:  Lab Results  Component Value Date   WBC 11.0 06/16/2016   HGB 12.2 06/16/2016   HCT 34.7 (L) 06/16/2016   MCV 92.6 06/16/2016   PLT 147 (L) 06/16/2016    Lab Results  Component Value Date   CREATININE 0.8 12/12/2011   BUN 10 12/12/2011   NA 137 12/12/2011   K 4.0 12/12/2011   CL 103 12/12/2011   CO2 24 12/12/2011    Lab Results  Component Value Date   ALT 15 12/12/2011   AST 16 12/12/2011   ALKPHOS 33 (L) 12/12/2011   BILITOT 1.0 12/12/2011    Lab  Results  Component Value Date   TSH 0.868 09/02/2018     Assessment:   . 1. Well woman exam with routine gynecological exam   2. Encounter for surveillance of contraceptive pills   3. Family history of kidney diseases   4. Cervical cancer screening   5. Screening for lipid disorders   6. Thyroid cyst   7. Constipation, unspecified constipation type   8. Personal history of covid-19     Plan:    Blood tests: CBC with diff, Comprehensive metabolic panel, Free T4 level and TSH. Pap smear performed today for cervical cancer screening.  Breast self exam technique reviewed and patient encouraged to perform self-exam monthly. Contraception: OCP (estrogen/progesterone). Refill given. Doing well.  Discussed healthy lifestyle modifications. Patient wonders if she should see someone regarding her family history of kidney issues. Can refer to Urology for consultation.  History of thyroid cyst, no new abnormalities noted. Will check thyroid panel.  Discussed constipation, can use stool softeners, Miralax, increase fiber and water intake.  Follow up in 1 year for annual exam.    Hildred Laser, MD [facility]

## 2019-12-10 NOTE — Progress Notes (Signed)
Pt is present for annual exam. Pt stated that she was doing well no problems. Need refills of Junel. Order placed. Sister mother and daughter has kidney issues that has left them all with one kidney. Pt is concerned on what to do to prevent this happening to her.  Flu vaccine completed 06/2019.

## 2019-12-11 LAB — LIPID PANEL
Chol/HDL Ratio: 3.4 ratio (ref 0.0–4.4)
Cholesterol, Total: 185 mg/dL (ref 100–199)
HDL: 55 mg/dL (ref >39)
LDL Chol Calc (NIH): 115 mg/dL — ABNORMAL HIGH (ref 0–99)
Triglycerides: 82 mg/dL (ref 0–149)
VLDL Cholesterol Cal: 15 mg/dL (ref 5–40)

## 2019-12-11 LAB — COMPREHENSIVE METABOLIC PANEL
ALT: 13 IU/L (ref 0–32)
AST: 15 IU/L (ref 0–40)
Albumin/Globulin Ratio: 2.1 (ref 1.2–2.2)
Albumin: 4.8 g/dL (ref 3.8–4.8)
Alkaline Phosphatase: 35 IU/L — ABNORMAL LOW (ref 39–117)
BUN/Creatinine Ratio: 11 (ref 9–23)
BUN: 8 mg/dL (ref 6–20)
Bilirubin Total: 0.6 mg/dL (ref 0.0–1.2)
CO2: 23 mmol/L (ref 20–29)
Calcium: 9.4 mg/dL (ref 8.7–10.2)
Chloride: 102 mmol/L (ref 96–106)
Creatinine, Ser: 0.71 mg/dL (ref 0.57–1.00)
GFR calc Af Amer: 125 mL/min/{1.73_m2} (ref >59)
GFR calc non Af Amer: 108 mL/min/{1.73_m2} (ref >59)
Globulin, Total: 2.3 g/dL (ref 1.5–4.5)
Glucose: 84 mg/dL (ref 65–99)
Potassium: 4.1 mmol/L (ref 3.5–5.2)
Sodium: 139 mmol/L (ref 134–144)
Total Protein: 7.1 g/dL (ref 6.0–8.5)

## 2019-12-11 LAB — THYROID PANEL WITH TSH
Free Thyroxine Index: 2.7 (ref 1.2–4.9)
T3 Uptake Ratio: 27 % (ref 24–39)
T4, Total: 10 ug/dL (ref 4.5–12.0)
TSH: 1.28 u[IU]/mL (ref 0.450–4.500)

## 2019-12-11 LAB — CBC
Hematocrit: 42.1 % (ref 34.0–46.6)
Hemoglobin: 14.3 g/dL (ref 11.1–15.9)
MCH: 32 pg (ref 26.6–33.0)
MCHC: 34 g/dL (ref 31.5–35.7)
MCV: 94 fL (ref 79–97)
Platelets: 235 10*3/uL (ref 150–450)
RBC: 4.47 x10E6/uL (ref 3.77–5.28)
RDW: 11.8 % (ref 11.7–15.4)
WBC: 7.8 10*3/uL (ref 3.4–10.8)

## 2019-12-15 LAB — CYTOLOGY - PAP
Comment: NEGATIVE
Diagnosis: NEGATIVE
High risk HPV: NEGATIVE

## 2019-12-28 ENCOUNTER — Ambulatory Visit: Payer: Self-pay | Attending: Internal Medicine

## 2019-12-28 DIAGNOSIS — Z23 Encounter for immunization: Secondary | ICD-10-CM

## 2019-12-28 NOTE — Progress Notes (Signed)
   Covid-19 Vaccination Clinic  Name:  Susan Hurst    MRN: [id number] DOB: 1981-08-10  12/28/2019  [name] was observed post Covid-19 immunization for 15 minutes without incident. She was provided with Vaccine Information Sheet and instruction to access the V-Safe system.   [name] was instructed to call 911 with any severe reactions post vaccine: Marland Kitchen Difficulty breathing  . Swelling of face and throat  . A fast heartbeat  . A bad rash all over body  . Dizziness and weakness   Immunizations Administered    Name Date Dose VIS Date Route   Pfizer COVID-19 Vaccine 12/28/2019 10:55 AM 0.3 mL 09/04/2019 Intramuscular   Manufacturer: [employer], [employer]   Lot: [id number]   NDC: [address]-9

## 2020-01-13 ENCOUNTER — Ambulatory Visit (INDEPENDENT_AMBULATORY_CARE_PROVIDER_SITE_OTHER): Payer: BC Managed Care – PPO | Admitting: Urology

## 2020-01-13 ENCOUNTER — Encounter: Payer: Self-pay | Admitting: Urology

## 2020-01-13 ENCOUNTER — Other Ambulatory Visit: Payer: Self-pay

## 2020-01-13 VITALS — BP 151/93 | HR 62 | Ht 68.0 in | Wt 133.0 lb

## 2020-01-13 DIAGNOSIS — Z841 Family history of disorders of kidney and ureter: Secondary | ICD-10-CM

## 2020-01-13 NOTE — Progress Notes (Signed)
01/13/20 4:05 PM   Susan Hurst 01/10/1981 [id number]  Referring provider: Lucille Passy, MD No address on file  Chief Complaint  Patient presents with  . family history of kidney disease    HPI: Susan Hurst is a 39 y.o. F who presents today for the evaluation and management of kidney disease.   She has a strong family history of what sounds like UPJ obstruction.  Her daughter was found to have a right hydronephrosis in utero and is being managed by pediatric urology.  Her sister was also recently diagnosed with a high-grade UPJ obstruction after having severe pain episodes and underwent what sounds like pyeloplasty without any issues.  Her mom was noted to have a "massively swollen kidney" in her early 55s and ended up having a nephrectomy.  She never had any previous renal imaging.  She denies any urinary issues.  She denies any episodes of flank pain.  She does occasionally have some low back pain but this does not lateralize.  Rare occasional urinary tract infections which she reports that she is able to clear with increasing fluids and her symptoms resolved quickly.  Creatinine is normal, 0.7.  Urinalysis today is negative.   PMH: Past Medical History:  Diagnosis Date  . Asthma    hx of- no meds in years  . Microscopic hematuria 11/10/2010   Qualifier: Diagnosis of  By: Deborra Medina MD, Tanja Port    . Personal history of covid-19 08/31/2019  . Thyroid cyst   . THYROID CYST 07/24/2010   Qualifier: Diagnosis of  By: Deborra Medina MD, Talia      Surgical History: Past Surgical History:  Procedure Laterality Date  . WISDOM TOOTH EXTRACTION  2001    Home Medications:  Allergies as of 01/13/2020      Reactions   Penicillins Hives, Rash      Medication List       Accurate as of January 13, 2020  4:05 PM. If you have any questions, ask your nurse or doctor.        Norethindrone Acetate-Ethinyl Estradiol 1.5-30 MG-MCG tablet Commonly known as: Junel 1.5/30 Take 1 tablet by  mouth daily.       Allergies:  Allergies  Allergen Reactions  . Penicillins Hives and Rash    Family History: Family History  Problem Relation Age of Onset  . Hyperlipidemia Father   . Hypertension Father   . Polycystic ovary syndrome Sister   . Hypothyroidism Sister   . Hydrocephalus Sister   . Bladder Cancer Maternal Uncle   . Stomach cancer Paternal Grandfather   . Hypothyroidism Other   . Prostate cancer Maternal Uncle   . Heart disease Neg Hx     Social History:  reports that she has never smoked. She has never used smokeless tobacco. She reports current alcohol use. She reports that she does not use drugs.   Physical Exam: BP (!) 151/93   Pulse 62   Ht 5\' 8"  (1.727 m)   Wt 133 lb (60.3 kg)   BMI 20.22 kg/m   Constitutional:  Alert and oriented, No acute distress. HEENT: [place] AT, moist mucus membranes.  Trachea midline, no masses. Cardiovascular: No clubbing, cyanosis, or edema. Respiratory: Normal respiratory effort, no increased work of breathing. GI: Abdomen is soft, nontender, nondistended, no abdominal masses GU: No CVA tenderness, no palpable abdominal masses Skin: No rashes, bruises or suspicious lesions. Neurologic: Grossly intact, no focal deficits, moving all 4 extremities. Psychiatric: Normal mood and affect.  Laboratory Data:  Lab Results  Component Value Date   CREATININE 0.71 12/10/2019   Urinalysis Negative  Pertinent Imaging: None   Assessment & Plan:    1. Family history of kidney disease We did have  lengthy discussion today about the risk of her UPJ obstruction which can be in fact hereditary, exact risk is somewhat unknown and she does have a strong family history of UPJ obstruction.  That being said, she is completely asymptomatic, has no flank pain, has a normal creatinine.  We discussed the risks and benefits of further imaging in detail and the consequences of pursuing this.  If she did in fact have a UPJ obstruction,  options would include surgical management versus observation especially in the absence of symptoms and the normal renal function.  Ultimately, after discussion she elected to forego renal imaging but advised that if she ever develops any pain or has any other concerning symptoms, we could always image down the road. - Urinalysis, Complete   Return if symptoms worsen or fail to improve.  [facility] [address] [place], [place] [address] [phone]  I, Donne Hazel, am acting as a scribe for Dr. Vanna Scotland,  I have reviewed the above documentation for accuracy and completeness, and I agree with the above.   Vanna Scotland, MD

## 2020-01-14 LAB — URINALYSIS, COMPLETE
Bilirubin, UA: NEGATIVE
Glucose, UA: NEGATIVE
Ketones, UA: NEGATIVE
Leukocytes,UA: NEGATIVE
Nitrite, UA: NEGATIVE
Protein,UA: NEGATIVE
Specific Gravity, UA: <1.005 — ABNORMAL LOW (ref 1.005–1.030)
Urobilinogen, Ur: 0.2 mg/dL (ref 0.2–1.0)
pH, UA: 6 (ref 5.0–7.5)

## 2020-01-14 LAB — MICROSCOPIC EXAMINATION: Bacteria, UA: NONE SEEN

## 2020-01-20 ENCOUNTER — Ambulatory Visit: Payer: Self-pay | Attending: Internal Medicine

## 2020-01-20 ENCOUNTER — Other Ambulatory Visit: Payer: Self-pay

## 2020-01-20 DIAGNOSIS — Z23 Encounter for immunization: Secondary | ICD-10-CM

## 2020-01-20 NOTE — Progress Notes (Signed)
   Covid-19 Vaccination Clinic  Name:  Susan Hurst    MRN: [id number] DOB: Jun 09, 1981  01/20/2020  Susan Hurst was observed post Covid-19 immunization for 15 minutes without incident. She was provided with Vaccine Information Sheet and instruction to access the V-Safe system.   Susan Hurst was instructed to call 911 with any severe reactions post vaccine: Marland Kitchen Difficulty breathing  . Swelling of face and throat  . A fast heartbeat  . A bad rash all over body  . Dizziness and weakness   Immunizations Administered    Name Date Dose VIS Date Route   Pfizer COVID-19 Vaccine 01/20/2020  9:49 AM 0.3 mL 11/18/2018 Intramuscular   Manufacturer: [employer], [employer]   Lot: [id number]   NDC: [address]-7

## 2020-11-24 ENCOUNTER — Encounter: Payer: Self-pay | Admitting: Obstetrics and Gynecology

## 2020-11-24 ENCOUNTER — Other Ambulatory Visit: Payer: Self-pay

## 2020-11-24 ENCOUNTER — Ambulatory Visit (INDEPENDENT_AMBULATORY_CARE_PROVIDER_SITE_OTHER): Payer: Self-pay | Admitting: Obstetrics and Gynecology

## 2020-11-24 VITALS — BP 127/82 | HR 64 | Ht 68.0 in | Wt 135.4 lb

## 2020-11-24 DIAGNOSIS — R399 Unspecified symptoms and signs involving the genitourinary system: Secondary | ICD-10-CM

## 2020-11-24 DIAGNOSIS — Z841 Family history of disorders of kidney and ureter: Secondary | ICD-10-CM

## 2020-11-24 DIAGNOSIS — Z87440 Personal history of urinary (tract) infections: Secondary | ICD-10-CM

## 2020-11-24 LAB — POCT URINALYSIS DIPSTICK
Bilirubin, UA: NEGATIVE
Glucose, UA: NEGATIVE
Ketones, UA: NEGATIVE
Nitrite, UA: NEGATIVE
Protein, UA: NEGATIVE
Spec Grav, UA: <=1.005 — AB (ref 1.010–1.025)
Urobilinogen, UA: 0.2 E.U./dL
pH, UA: 6 (ref 5.0–8.0)

## 2020-11-24 MED ORDER — CIPROFLOXACIN HCL 250 MG PO TABS: 250.0000 mg | ORAL_TABLET | Freq: Two times a day (BID) | ORAL | 0 refills | Status: DC

## 2020-11-24 NOTE — Progress Notes (Signed)
    GYNECOLOGY CLINIC PROGRESS NOTE Subjective:    Susan Hurst is a 39 y.o. [phone] female who complains of dysuria and incomplete bladder emptying for 2 days, although less bothersome today after increasing her fluid intake.  She also complains of back/flank pain that has been ongoing for ~ 1 week . Patient denies fever and vaginal discharge.  Patient does have a history of recurrent UTI.  Patient also has a family history of kidney disorders (sister with a high-grade UPJ obstruction, her mom was noted to have a "massively swollen kidney" in her early 59s and ended up having a nephrectomy, daughter born with only 1 kidney).   The following portions of the patient's history were reviewed and updated as appropriate: allergies, current medications, past family history, past medical history, past social history, past surgical history and problem list. Review of Systems Pertinent items noted in HPI and remainder of comprehensive ROS otherwise negative.     Objective:    BP 127/82   Pulse 64   Ht 5\' 8"  (1.727 m)   Wt 135 lb 6.4 oz (61.4 kg)   LMP 11/14/2020 (Within Days)   BMI 20.59 kg/m  General: alert and no distress  Abdomen: soft, non-tender, without masses or organomegaly   Back: spine nontender, CVA tenderness absent  GU: deferred   Laboratory:  . Results for orders placed or performed in visit on 11/24/20  POCT urinalysis dipstick  Result Value Ref Range   Color, UA yellow    Clarity, UA clear    Glucose, UA Negative Negative   Bilirubin, UA neg    Ketones, UA neg    Spec Grav, UA <=1.005 (A) 1.010 - 1.025   Blood, UA 1+    pH, UA 6.0 5.0 - 8.0   Protein, UA Negative Negative   Urobilinogen, UA 0.2 0.2 or 1.0 E.U./dL   Nitrite, UA neg    Leukocytes, UA Moderate (2+) (A) Negative   Appearance yellow;clear    Odor       Assessment:   1. UTI symptoms   2. Family history of kidney diseases   3. History of recurrent UTI (urinary tract infection)    Plan:   1.  Although patient's urine testing with no significant indicators of UTI, patient has classic symptoms. In light of symptoms and personal h/o recurrent UTI's, will emperically treat. Urine culture ordered.  2. Medications: ciprofloxacin (see orders) 3. Maintain adequate hydration 4.  Has had consult with Urology due to family history. Recommend further workup if patient becomes significantly symptomatic.  5. Follow up if symptoms not improving, and prn.  01/24/21, MD [facility]

## 2020-11-24 NOTE — Progress Notes (Signed)
Pt present for UTI symptoms. Pt c/o burning with urination x 4 days, lower back pain x 1 week, frequent urination, not able to fully empty bladder and has a family hx of kidney problems.

## 2020-11-24 NOTE — Patient Instructions (Signed)
Urinary Tract Infection, Adult A urinary tract infection (UTI) is an infection of any part of the urinary tract. The urinary tract includes:  The kidneys.  The ureters.  The bladder.  The urethra. These organs make, store, and get rid of pee (urine) in the body. What are the causes? This infection is caused by germs (bacteria) in your genital area. These germs grow and cause swelling (inflammation) of your urinary tract. What increases the risk? The following factors may make you more likely to develop this condition:  Using a small, thin tube (catheter) to drain pee.  Not being able to control when you pee or poop (incontinence).  Being female. If you are female, these things can increase the risk: ? Using these methods to prevent pregnancy:  A medicine that kills sperm (spermicide).  A device that blocks sperm (diaphragm). ? Having low levels of a female hormone (estrogen). ? Being pregnant. You are more likely to develop this condition if:  You have genes that add to your risk.  You are sexually active.  You take antibiotic medicines.  You have trouble peeing because of: ? A prostate that is bigger than normal, if you are female. ? A blockage in the part of your body that drains pee from the bladder. ? A kidney stone. ? A nerve condition that affects your bladder. ? Not getting enough to drink. ? Not peeing often enough.  You have other conditions, such as: ? Diabetes. ? A weak disease-fighting system (immune system). ? Sickle cell disease. ? Gout. ? Injury of the spine. What are the signs or symptoms? Symptoms of this condition include:  Needing to pee right away.  Peeing small amounts often.  Pain or burning when peeing.  Blood in the pee.  Pee that smells bad or not like normal.  Trouble peeing.  Pee that is cloudy.  Fluid coming from the vagina, if you are female.  Pain in the belly or lower back. Other symptoms include:  Vomiting.  Not  feeling hungry.  Feeling mixed up (confused). This may be the first symptom in older adults.  Being tired and grouchy (irritable).  A fever.  Watery poop (diarrhea). How is this treated?  Taking antibiotic medicine.  Taking other medicines.  Drinking enough water. In some cases, you may need to see a specialist. Follow these instructions at home: Medicines  Take over-the-counter and prescription medicines only as told by your doctor.  If you were prescribed an antibiotic medicine, take it as told by your doctor. Do not stop taking it even if you start to feel better. General instructions  Make sure you: ? Pee until your bladder is empty. ? Do not hold pee for a long time. ? Empty your bladder after sex. ? Wipe from front to back after peeing or pooping if you are a female. Use each tissue one time when you wipe.  Drink enough fluid to keep your pee pale yellow.  Keep all follow-up visits.   Contact a doctor if:  You do not get better after 1-2 days.  Your symptoms go away and then come back. Get help right away if:  You have very bad back pain.  You have very bad pain in your lower belly.  You have a fever.  You have chills.  You feeling like you will vomit or you vomit. Summary  A urinary tract infection (UTI) is an infection of any part of the urinary tract.  This condition is caused by   germs in your genital area.  There are many risk factors for a UTI.  Treatment includes antibiotic medicines.  Drink enough fluid to keep your pee pale yellow. This information is not intended to replace advice given to you by your health care provider. Make sure you discuss any questions you have with your health care provider. Document Revised: 04/22/2020 Document Reviewed: 04/22/2020 Elsevier Patient Education  2021 Elsevier Inc.  

## 2020-11-26 LAB — URINE CULTURE

## 2020-12-13 ENCOUNTER — Other Ambulatory Visit: Payer: Self-pay

## 2020-12-13 ENCOUNTER — Encounter: Payer: Self-pay | Admitting: Obstetrics and Gynecology

## 2020-12-13 ENCOUNTER — Ambulatory Visit (INDEPENDENT_AMBULATORY_CARE_PROVIDER_SITE_OTHER): Payer: BC Managed Care – PPO | Admitting: Obstetrics and Gynecology

## 2020-12-13 VITALS — BP 122/77 | HR 50 | Ht 68.0 in | Wt 136.3 lb

## 2020-12-13 DIAGNOSIS — N920 Excessive and frequent menstruation with regular cycle: Secondary | ICD-10-CM

## 2020-12-13 DIAGNOSIS — Z01419 Encounter for gynecological examination (general) (routine) without abnormal findings: Secondary | ICD-10-CM

## 2020-12-13 DIAGNOSIS — Z841 Family history of disorders of kidney and ureter: Secondary | ICD-10-CM

## 2020-12-13 MED ORDER — NORETHIN ACE-ETH ESTRAD-FE 1-20 MG-MCG PO TABS: 1.0000 | ORAL_TABLET | Freq: Every day | ORAL | 3 refills | Status: DC

## 2020-12-13 MED ORDER — LEVONORGEST-ETH ESTRAD 91-DAY 0.15-0.03 MG PO TABS: 1.0000 | ORAL_TABLET | Freq: Every day | ORAL | 4 refills | Status: DC

## 2020-12-13 NOTE — Progress Notes (Signed)
Pt present for annual exam. Pt stated that she was doing well. Pt declined flu vaccine.  

## 2020-12-13 NOTE — Progress Notes (Unsigned)
GYNECOLOGY ANNUAL PHYSICAL EXAM PROGRESS NOTE  Subjective:    Susan Hurst is a 40 y.o. [phone] married female who presents for an annual exam.The patient is sexually active.  The patient wears seatbelts: yes. The patient participates in regular exercise: no. Has the patient ever been transfused or tattooed?: no.   The patient has no complaints today. She states that the urinary symptoms she was experiencing a few weeks ago has resolved.     Gynecologic History Patient's last menstrual period was 12/09/2020 (exact date). Contraception: OCP (estrogen/progesterone). Needs refill.  History of STI's: Denies Last Pap: 12/10/2019. Results were: normal.  Denies h/o abnormal pap smears.  Menstrual History: Menarche age:  Patient's last menstrual period was 12/09/2020 (exact date). Period Duration (Days): 4-5 Period Pattern: Regular Menstrual Flow: Moderate,Heavy Menstrual Control: Panty liner,Thin pad,Maxi pad,Tampon Menstrual Control Change Freq (Hours): 4-6 Dysmenorrhea: None Dysmenorrhea Symptoms: Headache     OB History  Gravida Para Term Preterm AB Living  3 2 1 1 1 2   SAB IAB Ectopic Multiple Live Births  1 0 0 0 2    # Outcome Date GA Lbr Len/2nd Weight Sex Delivery Anes PTL Lv  3 Term 06/15/16 [redacted]w[redacted]d 02:15 / 00:24 6 lb 4.2 oz (2.84 kg) F Vag-Spont None  LIV     Name: Susan, Hurst     Apgar1: 8  Apgar5: 9  2 SAB 08/2015          1 Preterm 12/06/12 [redacted]w[redacted]d 01:20 / 00:28 5 lb 11.8 oz (2.603 kg) F Vag-Spont Local  LIV     Birth Comments: caput, brusing L forehead     Name: Susan Hurst     Apgar1: 7  Apgar5: 8    Obstetric Comments  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     Past Medical History:  Diagnosis Date  . Asthma    hx of- no meds in years  . Microscopic hematuria 11/10/2010   Qualifier: Diagnosis of  By: 11/12/2010 MD, Dayton Martes    . Personal history of COVID-19 08/31/2019  . Thyroid cyst   . THYROID CYST  07/24/2010   Qualifier: Diagnosis of  By: 07/26/2010 MD, Talia      Past Surgical History:  Procedure Laterality Date  . WISDOM TOOTH EXTRACTION  2001    Family History  Problem Relation Age of Onset  . Hyperlipidemia Father   . Hypertension Father   . Polycystic ovary syndrome Sister   . Hypothyroidism Sister   . Hydrocephalus Sister   . Bladder Cancer Maternal Uncle   . Stomach cancer Paternal Grandfather   . Hypothyroidism Other   . Prostate cancer Maternal Uncle   . Heart disease Neg Hx     Social History   Socioeconomic History  . Marital status: Married    Spouse name: Not on file  . Number of children: Not on file  . Years of education: Not on file  . Highest education level: Not on file  Occupational History  . Occupation: 2002    Employer: [employer]  Tobacco Use  . Smoking status: Never Smoker  . Smokeless tobacco: Never Used  Vaping Use  . Vaping Use: Never used  Substance and Sexual Activity  . Alcohol use: Yes    Comment: occcas  . Drug use: No  . Sexual activity: Yes    Birth control/protection: Pill  Other Topics Concern  . Not on file  Social History Narrative  .  Not on file   Social Determinants of Health   Financial Resource Strain: Not on file  Food Insecurity: Not on file  Transportation Needs: Not on file  Physical Activity: Not on file  Stress: Not on file  Social Connections: Not on file  Intimate Partner Violence: Not on file    Current Outpatient Medications on File Prior to Visit  Medication Sig Dispense Refill  . ibuprofen (ADVIL) 200 MG tablet Take 200 mg by mouth every 6 (six) hours as needed. As needed    . Norethindrone Acetate-Ethinyl Estradiol (JUNEL 1.5/30) 1.5-30 MG-MCG tablet Take 1 tablet by mouth daily. 84 tablet 3   No current facility-administered medications on file prior to visit.    Allergies  Allergen Reactions  . Penicillins Hives and Rash      Review of Systems Constitutional: negative  for chills, fatigue, fevers and sweats Eyes: negative for irritation, redness and visual disturbance Ears, nose, mouth, throat, and face: negative for hearing loss, nasal congestion, snoring and tinnitus Respiratory: negative for asthma, cough, sputum Cardiovascular: negative for chest pain, dyspnea, exertional chest pressure/discomfort, irregular heart beat, palpitations and syncope Gastrointestinal: negative for abdominal pain, nausea and vomiting.  Genitourinary: negative for abnormal menstrual periods (however does note cycles are somewhat heavy and last 6-7 days despite use of contraceptive).  Negative for genital lesions, sexual problems and vaginal discharge, dysuria and urinary incontinence.  Integument/breast: negative for breast lump, breast tenderness and nipple discharge Hematologic/lymphatic: negative for bleeding and easy bruising Musculoskeletal:negative for back pain and muscle weakness Neurological: negative for dizziness, headaches, vertigo and weakness Endocrine: negative for diabetic symptoms including polydipsia, polyuria and skin dryness Allergic/Immunologic: negative for hay fever and urticaria        Objective:  Blood pressure 122/77, pulse (!) 50, height 5\' 8"  (1.727 m), weight 136 lb 4.8 oz (61.8 kg), last menstrual period 12/09/2020, currently breastfeeding. Body mass index is 20.72 kg/m.  General Appearance:    Alert, cooperative, no distress, appears stated age  Head:    Normocephalic, without obvious abnormality, atraumatic  Eyes:    PERRL, conjunctiva/corneas clear, EOM's intact, both eyes  Ears:    Normal external ear canals, both ears  Nose:   Nares normal, septum midline, mucosa normal, no drainage or sinus tenderness  Throat:   Lips, mucosa, and tongue normal; teeth and gums normal  Neck:   Supple, symmetrical, trachea midline, no adenopathy; thyroid: no enlargement/tenderness/nodules; no carotid bruit or JVD  Back:     Symmetric, no curvature, ROM  normal, no CVA tenderness  Lungs:     Clear to auscultation bilaterally, respirations unlabored  Chest Wall:    No tenderness or deformity   Heart:    Regular rate and rhythm, S1 and S2 normal, no murmur, rub or gallop  Breast Exam:    No tenderness, masses, or nipple abnormality  Abdomen:     Soft, non-tender, bowel sounds active all four quadrants, no masses, no organomegaly.    Genitalia:    Pelvic:external genitalia normal, vagina without lesions, discharge, or tenderness, rectovaginal septum  normal. Cervix normal in appearance, no cervical motion tenderness, no adnexal masses or tenderness.  Uterus normal size, shape, mobile, regular contours, nontender.  Rectal:    Normal external sphincter.  No hemorrhoids appreciated. Internal exam not done.   Extremities:   Extremities normal, atraumatic, no cyanosis or edema  Pulses:   2+ and symmetric all extremities  Skin:   Skin color, texture, turgor normal, no rashes or lesions  Lymph nodes:   Cervical, supraclavicular, and axillary nodes normal  Neurologic:   CNII-XII intact, normal strength, sensation and reflexes throughout   .  Labs:  Lab Results  Component Value Date   WBC 7.8 12/10/2019   HGB 14.3 12/10/2019   HCT 42.1 12/10/2019   MCV 94 12/10/2019   PLT 235 12/10/2019    Lab Results  Component Value Date   CREATININE 0.71 12/10/2019   BUN 8 12/10/2019   NA 139 12/10/2019   K 4.1 12/10/2019   CL 102 12/10/2019   CO2 23 12/10/2019    Lab Results  Component Value Date   ALT 13 12/10/2019   AST 15 12/10/2019   ALKPHOS 35 (L) 12/10/2019   BILITOT 0.6 12/10/2019    Lab Results  Component Value Date   TSH 1.280 12/10/2019     Assessment:  . 1. Encounter for well woman exam with routine gynecological exam   2. Menorrhagia with regular cycle   3. Family history of kidney diseases    Plan:    Blood tests: deferred for this year. Will perform next year.  Pap smear up to date.  Breast self exam technique  reviewed and patient encouraged to perform self-exam monthly. Contraception: OCP (estrogen/progesterone). Discussed option of other contraceptive methods to help with heavier menses.  Patient would like to stick with pills, can try either Seasonale to decrease frequency of periods, or progesterone-only method (Slynd). Will try Slynd.  The pregnancy intention screening data (see screening) was reviewed. Potential methods of contraception were discussed. The patient elected to proceed with Oral Contraceptive.  Discussed healthy lifestyle modifications. Has had referral to Urology due to personal and family history  refer to Urology for consultation.  COVID vaccine: completed. Is due for booster.  Flu vaccine: declined.  Follow up in 1 year for annual exam.   Changed to seasonale due to length of cycle (6 days) ,slightly heavy.    Hildred Laser, MD [facility]

## 2020-12-13 NOTE — Patient Instructions (Signed)
Health Maintenance, Female Adopting a healthy lifestyle and getting preventive care are important in promoting health and wellness. Ask your health care provider about:  The right schedule for you to have regular tests and exams.  Things you can do on your own to prevent diseases and keep yourself healthy. What should I know about diet, weight, and exercise? Eat a healthy diet  Eat a diet that includes plenty of vegetables, fruits, low-fat dairy products, and lean protein.  Do not eat a lot of foods that are high in solid fats, added sugars, or sodium.   Maintain a healthy weight Body mass index (BMI) is used to identify weight problems. It estimates body fat based on height and weight. Your health care provider can help determine your BMI and help you achieve or maintain a healthy weight. Get regular exercise Get regular exercise. This is one of the most important things you can do for your health. Most adults should:  Exercise for at least 150 minutes each week. The exercise should increase your heart rate and make you sweat (moderate-intensity exercise).  Do strengthening exercises at least twice a week. This is in addition to the moderate-intensity exercise.  Spend less time sitting. Even light physical activity can be beneficial. Watch cholesterol and blood lipids Have your blood tested for lipids and cholesterol at 39 years of age, then have this test every 5 years. Have your cholesterol levels checked more often if:  Your lipid or cholesterol levels are high.  You are older than 40 years of age.  You are at high risk for heart disease. What should I know about cancer screening? Depending on your health history and family history, you may need to have cancer screening at various ages. This may include screening for:  Breast cancer.  Cervical cancer.  Colorectal cancer.  Skin cancer.  Lung cancer. What should I know about heart disease, diabetes, and high blood  pressure? Blood pressure and heart disease  High blood pressure causes heart disease and increases the risk of stroke. This is more likely to develop in people who have high blood pressure readings, are of African descent, or are overweight.  Have your blood pressure checked: ? Every 3-5 years if you are 18-39 years of age. ? Every year if you are 40 years old or older. Diabetes Have regular diabetes screenings. This checks your fasting blood sugar level. Have the screening done:  Once every three years after age 40 if you are at a normal weight and have a low risk for diabetes.  More often and at a younger age if you are overweight or have a high risk for diabetes. What should I know about preventing infection? Hepatitis B If you have a higher risk for hepatitis B, you should be screened for this virus. Talk with your health care provider to find out if you are at risk for hepatitis B infection. Hepatitis C Testing is recommended for:  Everyone born from 1945 through 1965.  Anyone with known risk factors for hepatitis C. Sexually transmitted infections (STIs)  Get screened for STIs, including gonorrhea and chlamydia, if: ? You are sexually active and are younger than 39 years of age. ? You are older than 39 years of age and your health care provider tells you that you are at risk for this type of infection. ? Your sexual activity has changed since you were last screened, and you are at increased risk for chlamydia or gonorrhea. Ask your health care provider   if you are at risk.  Ask your health care provider about whether you are at high risk for HIV. Your health care provider may recommend a prescription medicine to help prevent HIV infection. If you choose to take medicine to prevent HIV, you should first get tested for HIV. You should then be tested every 3 months for as long as you are taking the medicine. Pregnancy  If you are about to stop having your period (premenopausal) and  you may become pregnant, seek counseling before you get pregnant.  Take 400 to 800 micrograms (mcg) of folic acid every day if you become pregnant.  Ask for birth control (contraception) if you want to prevent pregnancy. Osteoporosis and menopause Osteoporosis is a disease in which the bones lose minerals and strength with aging. This can result in bone fractures. If you are 65 years old or older, or if you are at risk for osteoporosis and fractures, ask your health care provider if you should:  Be screened for bone loss.  Take a calcium or vitamin D supplement to lower your risk of fractures.  Be given hormone replacement therapy (HRT) to treat symptoms of menopause. Follow these instructions at home: Lifestyle  Do not use any products that contain nicotine or tobacco, such as cigarettes, e-cigarettes, and chewing tobacco. If you need help quitting, ask your health care provider.  Do not use street drugs.  Do not share needles.  Ask your health care provider for help if you need support or information about quitting drugs. Alcohol use  Do not drink alcohol if: ? Your health care provider tells you not to drink. ? You are pregnant, may be pregnant, or are planning to become pregnant.  If you drink alcohol: ? Limit how much you use to 0-1 drink a day. ? Limit intake if you are breastfeeding.  Be aware of how much alcohol is in your drink. In the [place] one drink equals one 12 oz bottle of beer (355 mL), one 5 oz glass of wine (148 mL), or one 1 oz glass of hard liquor (44 mL). General instructions  Schedule regular health, dental, and eye exams.  Stay current with your vaccines.  Tell your health care provider if: ? You often feel depressed. ? You have ever been abused or do not feel safe at home. Summary  Adopting a healthy lifestyle and getting preventive care are important in promoting health and wellness.  Follow your health care provider's instructions about healthy  diet, exercising, and getting tested or screened for diseases.  Follow your health care provider's instructions on monitoring your cholesterol and blood pressure. This information is not intended to replace advice given to you by your health care provider. Make sure you discuss any questions you have with your health care provider. Document Revised: 09/03/2018 Document Reviewed: 09/03/2018 Elsevier Patient Education  2021 Elsevier Inc.    Breast Self-Awareness Breast self-awareness means being familiar with how your breasts look and feel. It involves checking your breasts regularly and reporting any changes to your health care provider. Practicing breast self-awareness is important. Sometimes changes may not be harmful (are benign), but sometimes a change in your breasts can be a sign of a serious medical problem. It is important to learn how to do this procedure correctly so that you can catch problems early, when treatment is more likely to be successful. All women should practice breast self-awareness, including women who have had breast implants. What you need:  A mirror.  A   well-lit room. How to do a breast self-exam A breast self-exam is one way to learn what is normal for your breasts and whether your breasts are changing. To do a breast self-exam: Look for changes 1. Remove all the clothing above your waist. 2. Stand in front of a mirror in a room with good lighting. 3. Put your hands on your hips. 4. Push your hands firmly downward. 5. Compare your breasts in the mirror. Look for differences between them (asymmetry), such as: ? Differences in shape. ? Differences in size. ? Puckers, dips, and bumps in one breast and not the other. 6. Look at each breast for changes in the skin, such as: ? Redness. ? Scaly areas. 7. Look for changes in your nipples, such as: ? Discharge. ? Bleeding. ? Dimpling. ? Redness. ? A change in position.   Feel for changes Carefully feel your  breasts for lumps and changes. It is best to do this while lying on your back on the floor, and again while sitting or standing in the tub or shower with soapy water on your skin. Feel each breast in the following way: 1. Place the arm on the side of the breast you are examining above your head. 2. Feel your breast with the other hand. 3. Start in the nipple area and make -inch (2 cm) overlapping circles to feel your breast. Use the pads of your three middle fingers to do this. Apply light pressure, then medium pressure, then firm pressure. The light pressure will allow you to feel the tissue closest to the skin. The medium pressure will allow you to feel the tissue that is a little deeper. The firm pressure will allow you to feel the tissue close to the ribs. 4. Continue the overlapping circles, moving downward over the breast until you feel your ribs below your breast. 5. Move one finger-width toward the center of the body. Continue to use the -inch (2 cm) overlapping circles to feel your breast as you move slowly up toward your collarbone. 6. Continue the up-and-down exam using all three pressures until you reach your armpit.   Write down what you find Writing down what you find can help you remember what to discuss with your health care provider. Write down:  What is normal for each breast.  Any changes that you find in each breast, including: ? The kind of changes you find. ? Any pain or tenderness. ? Size and location of any lumps.  Where you are in your menstrual cycle, if you are still menstruating. General tips and recommendations  Examine your breasts every month.  If you are breastfeeding, the best time to examine your breasts is after a feeding or after using a breast pump.  If you menstruate, the best time to examine your breasts is 5-7 days after your period. Breasts are generally lumpier during menstrual periods, and it may be more difficult to notice changes.  With time  and practice, you will become more familiar with the variations in your breasts and more comfortable with the exam. Contact a health care provider if you:  See a change in the shape or size of your breasts or nipples.  See a change in the skin of your breast or nipples, such as a reddened or scaly area.  Have unusual discharge from your nipples.  Find a lump or thick area that was not there before.  Have pain in your breasts.  Have any concerns related to your breast health.   Summary  Breast self-awareness includes looking for physical changes in your breasts, as well as feeling for any changes within your breasts.  Breast self-awareness should be performed in front of a mirror in a well-lit room.  You should examine your breasts every month. If you menstruate, the best time to examine your breasts is 5-7 days after your menstrual period.  Let your health care provider know of any changes you notice in your breasts, including changes in size, changes on the skin, pain or tenderness, or unusual fluid from your nipples. This information is not intended to replace advice given to you by your health care provider. Make sure you discuss any questions you have with your health care provider. Document Revised: 04/29/2018 Document Reviewed: 04/29/2018 Elsevier Patient Education  2021 Elsevier Inc.   

## 2021-02-01 ENCOUNTER — Other Ambulatory Visit: Payer: Self-pay | Admitting: Obstetrics and Gynecology

## 2021-09-05 ENCOUNTER — Telehealth: Payer: Self-pay | Admitting: Obstetrics and Gynecology

## 2021-09-26 NOTE — Telephone Encounter (Signed)
Pt is calling in stating that she would like to have a refill on Rx JUNEL 1.5/30 MG MCG and pt stated that she is not doing too good she is having a lot of mood swings and do not want to continue to take it.  Pharm:  [facility] on [address]

## 2021-10-09 ENCOUNTER — Telehealth: Payer: Self-pay | Admitting: Obstetrics and Gynecology

## 2021-10-09 NOTE — Telephone Encounter (Signed)
Patient called and stated that the current birth control that she is taking makes her ill, super tense, and easy to upset. She Is scheduled for a annual exam on march 24th. Please advise.

## 2021-10-27 ENCOUNTER — Telehealth: Payer: BC Managed Care – PPO | Admitting: Obstetrics and Gynecology

## 2021-11-01 ENCOUNTER — Telehealth (INDEPENDENT_AMBULATORY_CARE_PROVIDER_SITE_OTHER): Payer: BC Managed Care – PPO | Admitting: Obstetrics and Gynecology

## 2021-11-01 ENCOUNTER — Encounter: Payer: Self-pay | Admitting: Obstetrics and Gynecology

## 2021-11-01 VITALS — Ht 68.0 in | Wt 130.0 lb

## 2021-11-01 DIAGNOSIS — Z308 Encounter for other contraceptive management: Secondary | ICD-10-CM

## 2021-11-01 MED ORDER — JUNEL 1.5/30 1.5-30 MG-MCG PO TABS: 1.0000 | ORAL_TABLET | Freq: Every day | ORAL | 3 refills | Status: DC

## 2021-11-01 NOTE — Progress Notes (Signed)
Virtual Visit via Video Note  I connected with Susan Hurst on 11/01/21 at  8:45 AM EST by a video enabled telemedicine application and verified that I am speaking with the correct person using two identifiers.  Location: Patient: Home Provider: Work   I discussed the limitations of evaluation and management by telemedicine and the availability of in person appointments. The patient expressed understanding and agreed to proceed.  History of Present Illness: Susan Hurst is a 41 y.o. female who presents for discussion of contraception.  She was changed from [place] to [place] ~ 1 year ago as she desired less menstruation and was having some occasional breakthrough bleeding. Notes that she noticed mood changes and returned back to her Junel but has run out. Returned to [employer] as she had leftover packs, and has begun noting the mood changes again. Would still like to have a decrease in frequency of her cycles but would like a new prescription for Junel.     Observations/Objective: Height 5\' 8"  (1.727 m), weight 130 lb (59 kg), last menstrual period 09/04/2021, currently breastfeeding.   Assessment and Plan: Contraception management - will change to Junel, and prescribe continuously.   Follow Up Instructions:   Return to clinic for any scheduled appointments or for any gynecologic concerns as needed.   I discussed the assessment and treatment plan with the patient. The patient was provided an opportunity to ask questions and all were answered. The patient agreed with the plan and demonstrated an understanding of the instructions.   The patient was advised to call back or seek an in-person evaluation if the symptoms worsen or if the condition fails to improve as anticipated.  I provided 5 minutes of non-face-to-face time during this encounter.   Rubie Maid, MD [facility]

## 2021-12-15 ENCOUNTER — Encounter: Payer: Self-pay | Admitting: Obstetrics and Gynecology

## 2022-01-12 ENCOUNTER — Encounter: Payer: BC Managed Care – PPO | Admitting: Obstetrics and Gynecology

## 2022-02-26 NOTE — Progress Notes (Unsigned)
GYNECOLOGY ANNUAL PHYSICAL EXAM PROGRESS NOTE  Subjective:    Susan Hurst is a 41 y.o. [phone] female who presents for an annual exam. The patient has no complaints today. The patient is sexually active. The patient participates in regular exercise: no.  Reports that since switching her birth control several months ago, she has had no further issues with irregular bleeding.   Menstrual History: No LMP recorded. Period Cycle (Days): 28 Period Duration (Days): 4 Period Pattern: Regular Menstrual Flow: Moderate Menstrual Control: Maxi pad, Tampon Menstrual Control Change Freq (Hours): 4 Dysmenorrhea: None   Gynecologic History:  Contraception: OCP (estrogen/progesterone) History of STI's: Denies Last Pap: 12/10/2019. Results were: normal.  Denies h/o abnormal pap smears.    Upstream - 02/28/22 1123       Pregnancy Intention Screening   Does the patient want to become pregnant in the next year? Ok Either Way    Does the patient's partner want to become pregnant in the next year? No    Would the patient like to discuss contraceptive options today? No      Contraception Wrap Up   Current Method Oral Contraceptive    End Method Oral Contraceptive    Contraception Counseling Provided No            The pregnancy intention screening data noted above was reviewed. Potential methods of contraception were discussed. The patient elected to proceed with Oral Contraceptive.   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     Past Medical History:  Diagnosis Date   Asthma    hx of- no meds in years   Microscopic hematuria 11/10/2010   Qualifier: Diagnosis of  By: 11/12/2010 MD, Talia     Personal history of COVID-19 08/31/2019   Thyroid cyst    THYROID CYST 07/24/2010   Qualifier: Diagnosis of  By: 07/26/2010 MD, Talia      Past Surgical History:  Procedure Laterality Date   WISDOM TOOTH EXTRACTION  2001    Family History  Problem Relation Age of Onset   Hyperlipidemia Father    Hypertension Father    Polycystic ovary syndrome Sister    Hypothyroidism Sister    Hydrocephalus Sister    Bladder Cancer Maternal Uncle    Stomach cancer Paternal Grandfather    Hypothyroidism Other    Prostate cancer Maternal Uncle    Heart disease Neg Hx     Social History   Socioeconomic History   Marital status: Married    Spouse name: Not on file   Number of children: Not on file   Years of education: Not on file  Highest education level: Not on file  Occupational History   Occupation: Doctor, general practice    Employer: [employer]  Tobacco Use   Smoking status: Never   Smokeless tobacco: Never  Vaping Use   Vaping Use: Never used  Substance and Sexual Activity   Alcohol use: Yes    Comment: occcas   Drug use: No   Sexual activity: Yes    Birth control/protection: Pill  Other Topics Concern   Not on file  Social History Narrative   Not on file   Social Determinants of Health   Financial Resource Strain: Not on file  Food Insecurity: Not on file  Transportation Needs: Not on file  Physical Activity: Not on file  Stress: Not on file  Social Connections: Not on file  Intimate Partner Violence: Not on file    Current Outpatient Medications on File Prior to Visit  Medication Sig Dispense Refill   ibuprofen (ADVIL) 200 MG tablet Take 200 mg by mouth every 6 (six) hours  as needed. As needed (Patient not taking: Reported on 11/01/2021)     JUNEL 1.5/30 1.5-30 MG-MCG tablet Take 1 tablet by mouth daily. Take continuously. Skip placebo pills 112 tablet 3   No current facility-administered medications on file prior to visit.    Allergies  Allergen Reactions   Penicillins Hives and Rash     Review of Systems Constitutional: negative for chills, fatigue, fevers and sweats Eyes: negative for irritation, redness and visual disturbance Ears, nose, mouth, throat, and face: negative for hearing loss, nasal congestion, snoring and tinnitus Respiratory: negative for asthma, cough, sputum Cardiovascular: negative for chest pain, dyspnea, exertional chest pressure/discomfort, irregular heart beat, palpitations and syncope Gastrointestinal: negative for abdominal pain, change in bowel habits, nausea and vomiting Genitourinary: negative for abnormal menstrual periods, genital lesions, sexual problems and vaginal discharge, dysuria and urinary incontinence Integument/breast: negative for breast lump, breast tenderness and nipple discharge Hematologic/lymphatic: negative for bleeding and easy bruising Musculoskeletal:negative for back pain and muscle weakness Neurological: negative for dizziness, headaches, vertigo and weakness Endocrine: negative for diabetic symptoms including polydipsia, polyuria and skin dryness Allergic/Immunologic: negative for hay fever and urticaria      Objective:  Blood pressure 110/70, pulse (!) 53, resp. rate 16, height 5\' 8"  (1.727 m), weight 133 lb 3.2 oz (60.4 kg), last menstrual period 02/05/2022, currently breastfeeding.  Body mass index is 20.25 kg/m.  General Appearance:    Alert, cooperative, no distress, appears stated age  Head:    Normocephalic, without obvious abnormality, atraumatic  Eyes:    PERRL, conjunctiva/corneas clear, EOM's intact, both eyes  Ears:    Normal external ear canals, both ears  Nose:   Nares normal, septum  midline, mucosa normal, no drainage or sinus tenderness  Throat:   Lips, mucosa, and tongue normal; teeth and gums normal  Neck:   Supple, symmetrical, trachea midline, no adenopathy; thyroid: no enlargement/tenderness/nodules; no carotid bruit or JVD  Back:     Symmetric, no curvature, ROM normal, no CVA tenderness  Lungs:     Clear to auscultation bilaterally, respirations unlabored  Chest Wall:    No tenderness or deformity   Heart:    Regular rate and rhythm, S1 and S2 normal, no murmur, rub or gallop  Breast Exam:    No tenderness, masses, or nipple abnormality  Abdomen:     Soft, non-tender, bowel sounds active all four quadrants, no masses, no organomegaly.    Genitalia:    Pelvic:external genitalia normal, internal exam  deferred.   Extremities:   Extremities normal, atraumatic, no cyanosis or edema  Pulses:   2+ and symmetric all extremities  Skin:   Skin color, texture, turgor normal, no rashes or lesions  Lymph nodes:   Cervical, supraclavicular, and axillary nodes normal  Neurologic:   CNII-XII intact, normal strength, sensation and reflexes throughout   .  Labs:  Lab Results  Component Value Date   WBC 7.8 12/10/2019   HGB 14.3 12/10/2019   HCT 42.1 12/10/2019   MCV 94 12/10/2019   PLT 235 12/10/2019    Lab Results  Component Value Date   CREATININE 0.71 12/10/2019   BUN 8 12/10/2019   NA 139 12/10/2019   K 4.1 12/10/2019   CL 102 12/10/2019   CO2 23 12/10/2019    Lab Results  Component Value Date   ALT 13 12/10/2019   AST 15 12/10/2019   ALKPHOS 35 (L) 12/10/2019   BILITOT 0.6 12/10/2019    Lab Results  Component Value Date   TSH 1.280 12/10/2019     Assessment:   1. Encounter for well woman exam with routine gynecological exam   2. Encounter for screening mammogram for malignant neoplasm of breast   3. THYROID CYST   4. Screening for diabetes mellitus (DM)   5. Screening cholesterol level   6. Menorrhagia with regular cycle      Plan:   Blood tests: Basic metabolic panel, CBC with diff, Lipoproteins, Thyroid panel, and A1c. Breast self exam technique reviewed and patient encouraged to perform self-exam monthly. Contraception: OCP (estrogen/progesterone). Discussed healthy lifestyle modifications. Mammogram ordered Pap smear  UTD . Due in 1 year COVID vaccination status: Menorrhagia managed with OCPs H/o thyroid cyst, thyroid panel ordered.  Follow up in 1 year for annual exam   Hildred Laserherry, Shermar Friedland, MD [facility]

## 2022-02-28 ENCOUNTER — Ambulatory Visit (INDEPENDENT_AMBULATORY_CARE_PROVIDER_SITE_OTHER): Payer: BC Managed Care – PPO | Admitting: Obstetrics and Gynecology

## 2022-02-28 ENCOUNTER — Encounter: Payer: Self-pay | Admitting: Obstetrics and Gynecology

## 2022-02-28 VITALS — BP 110/70 | HR 53 | Resp 16 | Ht 68.0 in | Wt 133.2 lb

## 2022-02-28 DIAGNOSIS — Z131 Encounter for screening for diabetes mellitus: Secondary | ICD-10-CM | POA: Diagnosis not present

## 2022-02-28 DIAGNOSIS — Z1231 Encounter for screening mammogram for malignant neoplasm of breast: Secondary | ICD-10-CM

## 2022-02-28 DIAGNOSIS — Z01419 Encounter for gynecological examination (general) (routine) without abnormal findings: Secondary | ICD-10-CM | POA: Diagnosis not present

## 2022-02-28 DIAGNOSIS — N920 Excessive and frequent menstruation with regular cycle: Secondary | ICD-10-CM

## 2022-02-28 DIAGNOSIS — E041 Nontoxic single thyroid nodule: Secondary | ICD-10-CM | POA: Diagnosis not present

## 2022-02-28 DIAGNOSIS — Z13220 Encounter for screening for lipoid disorders: Secondary | ICD-10-CM

## 2022-02-28 NOTE — Patient Instructions (Signed)
Preventive Care 40-40 Years Old, Female Preventive care refers to lifestyle choices and visits with your health care provider that can promote health and wellness. Preventive care visits are also called wellness exams. What can I expect for my preventive care visit? Counseling Your health care provider may ask you questions about your: Medical history, including: Past medical problems. Family medical history. Pregnancy history. Current health, including: Menstrual cycle. Method of birth control. Emotional well-being. Home life and relationship well-being. Sexual activity and sexual health. Lifestyle, including: Alcohol, nicotine or tobacco, and drug use. Access to firearms. Diet, exercise, and sleep habits. Work and work environment. Sunscreen use. Safety issues such as seatbelt and bike helmet use. Physical exam Your health care provider will check your: Height and weight. These may be used to calculate your BMI (body mass index). BMI is a measurement that tells if you are at a healthy weight. Waist circumference. This measures the distance around your waistline. This measurement also tells if you are at a healthy weight and may help predict your risk of certain diseases, such as type 2 diabetes and high blood pressure. Heart rate and blood pressure. Body temperature. Skin for abnormal spots. What immunizations do I need?  Vaccines are usually given at various ages, according to a schedule. Your health care provider will recommend vaccines for you based on your age, medical history, and lifestyle or other factors, such as travel or where you work. What tests do I need? Screening Your health care provider may recommend screening tests for certain conditions. This may include: Lipid and cholesterol levels. Diabetes screening. This is done by checking your blood sugar (glucose) after you have not eaten for a while (fasting). Pelvic exam and Pap test. Hepatitis B test. Hepatitis C  test. HIV (human immunodeficiency virus) test. STI (sexually transmitted infection) testing, if you are at risk. Lung cancer screening. Colorectal cancer screening. Mammogram. Talk with your health care provider about when you should start having regular mammograms. This may depend on whether you have a family history of breast cancer. BRCA-related cancer screening. This may be done if you have a family history of breast, ovarian, tubal, or peritoneal cancers. Bone density scan. This is done to screen for osteoporosis. Talk with your health care provider about your test results, treatment options, and if necessary, the need for more tests. Follow these instructions at home: Eating and drinking  Eat a diet that includes fresh fruits and vegetables, whole grains, lean protein, and low-fat dairy products. Take vitamin and mineral supplements as recommended by your health care provider. Do not drink alcohol if: Your health care provider tells you not to drink. You are pregnant, may be pregnant, or are planning to become pregnant. If you drink alcohol: Limit how much you have to 0-1 drink a day. Know how much alcohol is in your drink. In the [place] one drink equals one 12 oz bottle of beer (355 mL), one 5 oz glass of wine (148 mL), or one 1 oz glass of hard liquor (44 mL). Lifestyle Brush your teeth every morning and night with fluoride toothpaste. Floss one time each day. Exercise for at least 30 minutes 5 or more days each week. Do not use any products that contain nicotine or tobacco. These products include cigarettes, chewing tobacco, and vaping devices, such as e-cigarettes. If you need help quitting, ask your health care provider. Do not use drugs. If you are sexually active, practice safe sex. Use a condom or other form of protection to   prevent STIs. If you do not wish to become pregnant, use a form of birth control. If you plan to become pregnant, see your health care provider for a  prepregnancy visit. Take aspirin only as told by your health care provider. Make sure that you understand how much to take and what form to take. Work with your health care provider to find out whether it is safe and beneficial for you to take aspirin daily. Find healthy ways to manage stress, such as: Meditation, yoga, or listening to music. Journaling. Talking to a trusted person. Spending time with friends and family. Minimize exposure to UV radiation to reduce your risk of skin cancer. Safety Always wear your seat belt while driving or riding in a vehicle. Do not drive: If you have been drinking alcohol. Do not ride with someone who has been drinking. When you are tired or distracted. While texting. If you have been using any mind-altering substances or drugs. Wear a helmet and other protective equipment during sports activities. If you have firearms in your house, make sure you follow all gun safety procedures. Seek help if you have been physically or sexually abused. What's next? Visit your health care provider once a year for an annual wellness visit. Ask your health care provider how often you should have your eyes and teeth checked. Stay up to date on all vaccines. This information is not intended to replace advice given to you by your health care provider. Make sure you discuss any questions you have with your health care provider. Document Revised: 03/08/2021 Document Reviewed: 03/08/2021 Elsevier Patient Education  [place].

## 2022-03-01 LAB — CBC
Hematocrit: 42.5 % (ref 34.0–46.6)
Hemoglobin: 14.5 g/dL (ref 11.1–15.9)
MCH: 30.9 pg (ref 26.6–33.0)
MCHC: 34.1 g/dL (ref 31.5–35.7)
MCV: 91 fL (ref 79–97)
Platelets: 251 10*3/uL (ref 150–450)
RBC: 4.69 x10E6/uL (ref 3.77–5.28)
RDW: 11.7 % (ref 11.7–15.4)
WBC: 6.5 10*3/uL (ref 3.4–10.8)

## 2022-03-01 LAB — COMPREHENSIVE METABOLIC PANEL
ALT: 10 IU/L (ref 0–32)
AST: 17 IU/L (ref 0–40)
Albumin/Globulin Ratio: 1.8 (ref 1.2–2.2)
Albumin: 4.2 g/dL (ref 3.8–4.8)
Alkaline Phosphatase: 39 IU/L — ABNORMAL LOW (ref 44–121)
BUN/Creatinine Ratio: 8 — ABNORMAL LOW (ref 9–23)
BUN: 6 mg/dL (ref 6–24)
Bilirubin Total: 0.6 mg/dL (ref 0.0–1.2)
CO2: 24 mmol/L (ref 20–29)
Calcium: 9.1 mg/dL (ref 8.7–10.2)
Chloride: 106 mmol/L (ref 96–106)
Creatinine, Ser: 0.71 mg/dL (ref 0.57–1.00)
Globulin, Total: 2.3 g/dL (ref 1.5–4.5)
Glucose: 84 mg/dL (ref 70–99)
Potassium: 5.1 mmol/L (ref 3.5–5.2)
Sodium: 141 mmol/L (ref 134–144)
Total Protein: 6.5 g/dL (ref 6.0–8.5)
eGFR: 110 mL/min/{1.73_m2} (ref >59)

## 2022-03-01 LAB — LIPID PANEL
Chol/HDL Ratio: 3.6 ratio (ref 0.0–4.4)
Cholesterol, Total: 191 mg/dL (ref 100–199)
HDL: 53 mg/dL (ref >39)
LDL Chol Calc (NIH): 123 mg/dL — ABNORMAL HIGH (ref 0–99)
Triglycerides: 83 mg/dL (ref 0–149)
VLDL Cholesterol Cal: 15 mg/dL (ref 5–40)

## 2022-03-01 LAB — THYROID PANEL WITH TSH
Free Thyroxine Index: 2.3 (ref 1.2–4.9)
T3 Uptake Ratio: 25 % (ref 24–39)
T4, Total: 9.2 ug/dL (ref 4.5–12.0)
TSH: 0.812 u[IU]/mL (ref 0.450–4.500)

## 2022-03-01 LAB — HEMOGLOBIN A1C
Est. average glucose Bld gHb Est-mCnc: 108 mg/dL
Hgb A1c MFr Bld: 5.4 % (ref 4.8–5.6)

## 2022-03-26 ENCOUNTER — Ambulatory Visit
Admission: RE | Admit: 2022-03-26 | Discharge: 2022-03-26 | Disposition: A | Discharge status: Home / Self Care | Payer: BC Managed Care – PPO | Source: Ambulatory Visit | Attending: Obstetrics and Gynecology | Admitting: Obstetrics and Gynecology

## 2022-03-26 DIAGNOSIS — Z01419 Encounter for gynecological examination (general) (routine) without abnormal findings: Secondary | ICD-10-CM | POA: Insufficient documentation

## 2022-03-26 DIAGNOSIS — Z1231 Encounter for screening mammogram for malignant neoplasm of breast: Secondary | ICD-10-CM | POA: Diagnosis present

## 2022-09-25 ENCOUNTER — Other Ambulatory Visit: Payer: Self-pay

## 2022-09-25 ENCOUNTER — Ambulatory Visit (INDEPENDENT_AMBULATORY_CARE_PROVIDER_SITE_OTHER): Payer: BC Managed Care – PPO

## 2022-09-25 VITALS — BP 116/73 | HR 69 | Wt 137.0 lb

## 2022-09-25 DIAGNOSIS — R350 Frequency of micturition: Secondary | ICD-10-CM

## 2022-09-25 DIAGNOSIS — R300 Dysuria: Secondary | ICD-10-CM | POA: Diagnosis not present

## 2022-09-25 LAB — POCT URINALYSIS DIPSTICK
Bilirubin, UA: NEGATIVE
Glucose, UA: NEGATIVE
Ketones, UA: NEGATIVE
Nitrite, UA: NEGATIVE
Protein, UA: NEGATIVE
Spec Grav, UA: 1.01 (ref 1.010–1.025)
Urobilinogen, UA: NEGATIVE E.U./dL — AB
pH, UA: 6 (ref 5.0–8.0)

## 2022-09-25 MED ORDER — SULFAMETHOXAZOLE-TRIMETHOPRIM 800-160 MG PO TABS: 1.0000 | ORAL_TABLET | Freq: Two times a day (BID) | ORAL | 1 refills | Status: DC

## 2022-09-25 NOTE — Progress Notes (Signed)
Pt here for urine drop off; c/o burning and freq since last Wed.  Pt aware will be call as to whether we will send rx or not which we will.

## 2022-09-27 ENCOUNTER — Other Ambulatory Visit: Payer: Self-pay

## 2022-09-28 ENCOUNTER — Encounter: Payer: Self-pay | Admitting: Certified Nurse Midwife

## 2022-09-28 ENCOUNTER — Other Ambulatory Visit: Payer: Self-pay | Admitting: Certified Nurse Midwife

## 2022-09-28 LAB — URINE CULTURE

## 2022-09-28 MED ORDER — NITROFURANTOIN MONOHYD MACRO 100 MG PO CAPS: 100.0000 mg | ORAL_CAPSULE | Freq: Two times a day (BID) | ORAL | 0 refills | Status: AC

## 2023-03-12 ENCOUNTER — Encounter: Payer: BC Managed Care – PPO | Admitting: Obstetrics and Gynecology

## 2023-04-01 ENCOUNTER — Other Ambulatory Visit: Payer: Self-pay | Admitting: Obstetrics and Gynecology

## 2023-04-01 DIAGNOSIS — Z1231 Encounter for screening mammogram for malignant neoplasm of breast: Secondary | ICD-10-CM

## 2023-04-02 ENCOUNTER — Other Ambulatory Visit (HOSPITAL_COMMUNITY)
Admission: RE | Admit: 2023-04-02 | Discharge: 2023-04-02 | Disposition: A | Discharge status: Home / Self Care | Payer: No Typology Code available for payment source | Source: Ambulatory Visit | Attending: Obstetrics and Gynecology | Admitting: Obstetrics and Gynecology

## 2023-04-02 ENCOUNTER — Encounter: Payer: Self-pay | Admitting: Obstetrics and Gynecology

## 2023-04-02 ENCOUNTER — Ambulatory Visit: Payer: No Typology Code available for payment source | Admitting: Obstetrics and Gynecology

## 2023-04-02 VITALS — BP 128/85 | HR 65 | Resp 16 | Ht 68.5 in | Wt 134.0 lb

## 2023-04-02 DIAGNOSIS — Z01419 Encounter for gynecological examination (general) (routine) without abnormal findings: Secondary | ICD-10-CM

## 2023-04-02 DIAGNOSIS — Z13220 Encounter for screening for lipoid disorders: Secondary | ICD-10-CM

## 2023-04-02 DIAGNOSIS — Z124 Encounter for screening for malignant neoplasm of cervix: Secondary | ICD-10-CM

## 2023-04-02 DIAGNOSIS — Z131 Encounter for screening for diabetes mellitus: Secondary | ICD-10-CM

## 2023-04-02 DIAGNOSIS — Z1159 Encounter for screening for other viral diseases: Secondary | ICD-10-CM

## 2023-04-02 NOTE — Addendum Note (Signed)
Addended by: Tommie Raymond on: 04/02/2023 10:57 AM   Modules accepted: Orders

## 2023-04-02 NOTE — Progress Notes (Signed)
GYNECOLOGY ANNUAL PHYSICAL EXAM PROGRESS NOTE  Subjective:    Susan Hurst is a 42 y.o. [phone] female who presents for an annual exam. The patient has no complaints today. The patient is sexually active. The patient participates in regular exercise: yes. Has the patient ever been transfused or tattooed?: yes. The patient reports that there is not domestic violence in her life.    Menstrual History: Menarche age: 65 No LMP recorded. Period Cycle (Days): 90 Period Duration (Days): 4 Period Pattern: Regular Menstrual Flow: Moderate Menstrual Control: Maxi pad, Tampon Menstrual Control Change Freq (Hours): 4 Dysmenorrhea: None     Gynecologic History:  Contraception: OCP (estrogen/progesterone), q 3 month method History of STI's: Denies Last Pap: 12/10/2019. Results were: normal.  Denies h/o abnormal pap smears.  Last mammogram: 03/26/2022. Results were: normal    OB History  Gravida Para Term Preterm AB Living  3 2 1 1 1 2   SAB IAB Ectopic Multiple Live Births  1 0 0 0 2    # Outcome Date GA Lbr Len/2nd Weight Sex Delivery Anes PTL Lv  3 Term 06/15/16 [redacted]w[redacted]d 02:15 / 00:24 6 lb 4.2 oz (2.84 kg) F Vag-Spont None  LIV     Name: ALONDRIA, MOUSSEAU     Apgar1: 8  Apgar5: 9  2 SAB 08/2015          1 Preterm 12/06/12 [redacted]w[redacted]d 01:20 / 00:28 5 lb 11.8 oz (2.603 kg) F Vag-Spont Local  LIV     Birth Comments: caput, brusing L forehead     Name: Borromeo,GIRL Brinlyn     Apgar1: 7  Apgar5: 8    Obstetric Comments  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     Past Medical History:  Diagnosis Date   Asthma    hx of- no meds in years   Microscopic hematuria 11/10/2010   Qualifier: Diagnosis of  By: Dayton Martes MD, Talia     Personal history of COVID-19 08/31/2019   Thyroid cyst    THYROID CYST 07/24/2010   Qualifier: Diagnosis of  By: Dayton Martes MD, Talia      Past Surgical History:  Procedure Laterality Date   WISDOM TOOTH EXTRACTION  2001     Family History  Problem Relation Age of Onset   Hyperlipidemia Father    Hypertension Father    Polycystic ovary syndrome Sister    Hypothyroidism Sister    Hydrocephalus Sister    Bladder Cancer Maternal Uncle    Stomach cancer Paternal Grandfather    Hypothyroidism Other    Prostate cancer Maternal Uncle    Heart disease Neg Hx     Social History   Socioeconomic History   Marital status: Married    Spouse name: Not on file   Number of children: Not on file   Years of education: Not on file   Highest education level: Not on file  Occupational History   Occupation: Doctor, general practice    Employer: [employer]  Tobacco Use   Smoking status: Never   Smokeless tobacco: Never  Vaping Use   Vaping Use: Never used  Substance and Sexual Activity   Alcohol use: Yes    Comment: occcas   Drug use: No   Sexual activity: Yes    Birth control/protection: Pill  Other Topics Concern   Not on file  Social History Narrative   Not on file   Social Determinants of Health   Financial Resource Strain: Not on  file  Food Insecurity: Not on file  Transportation Needs: Not on file  Physical Activity: Not on file  Stress: Not on file  Social Connections: Not on file  Intimate Partner Violence: Not on file    Current Outpatient Medications on File Prior to Visit  Medication Sig Dispense Refill   ibuprofen (ADVIL) 200 MG tablet Take 200 mg by mouth every 6 (six) hours as needed. As needed     JUNEL 1.5/30 1.5-30 MG-MCG tablet TAKE 1 TABLET BY MOUTH DAILY. TAKE CONTINUOUSLY. SKIP PLACEBO PILLS 105 tablet 3   No current facility-administered medications on file prior to visit.    Allergies  Allergen Reactions   Penicillins Hives and Rash     Review of Systems Constitutional: negative for chills, fatigue, fevers and sweats Eyes: negative for irritation, redness and visual disturbance Ears, nose, mouth, throat, and face: negative for hearing loss, nasal congestion, snoring  and tinnitus Respiratory: negative for asthma, cough, sputum Cardiovascular: negative for chest pain, dyspnea, exertional chest pressure/discomfort, irregular heart beat, palpitations and syncope Gastrointestinal: negative for abdominal pain, change in bowel habits, nausea and vomiting Genitourinary: negative for abnormal menstrual periods, genital lesions, sexual problems and vaginal discharge, dysuria and urinary incontinence Integument/breast: negative for breast lump, breast tenderness and nipple discharge Hematologic/lymphatic: negative for bleeding and easy bruising Musculoskeletal:negative for back pain and muscle weakness Neurological: negative for dizziness, headaches, vertigo and weakness Endocrine: negative for diabetic symptoms including polydipsia, polyuria and skin dryness Allergic/Immunologic: negative for hay fever and urticaria      Objective:  Blood pressure 128/85, pulse 65, resp. rate 16, height 5' 8.5" (1.74 m), weight 134 lb (60.8 kg), last menstrual period 03/20/2023, not currently breastfeeding.     General Appearance:    Alert, cooperative, no distress, appears stated age  Head:    Normocephalic, without obvious abnormality, atraumatic  Eyes:    PERRL, conjunctiva/corneas clear, EOM's intact, both eyes  Ears:    Normal external ear canals, both ears  Nose:   Nares normal, septum midline, mucosa normal, no drainage or sinus tenderness  Throat:   Lips, mucosa, and tongue normal; teeth and gums normal  Neck:   Supple, symmetrical, trachea midline, no adenopathy; thyroid: no enlargement/tenderness/nodules; no carotid bruit or JVD  Back:     Symmetric, no curvature, ROM normal, no CVA tenderness  Lungs:     Clear to auscultation bilaterally, respirations unlabored  Chest Wall:    No tenderness or deformity   Heart:    Regular rate and rhythm, S1 and S2 normal, no murmur, rub or gallop  Breast Exam:    No tenderness, masses, or nipple abnormality  Abdomen:     Soft,  non-tender, bowel sounds active all four quadrants, no masses, no organomegaly.    Genitalia:    Pelvic:external genitalia normal, vagina without lesions, discharge, or tenderness, rectovaginal septum  normal. Cervix normal in appearance, no cervical motion tenderness, no adnexal masses or tenderness.  Uterus normal size, shape, mobile, regular contours, nontender.  Rectal:    Normal external sphincter.  No hemorrhoids appreciated. Internal exam not done.   Extremities:   Extremities normal, atraumatic, no cyanosis or edema  Pulses:   2+ and symmetric all extremities  Skin:   Skin color, texture, turgor normal, no rashes or lesions  Lymph nodes:   Cervical, supraclavicular, and axillary nodes normal  Neurologic:   CNII-XII intact, normal strength, sensation and reflexes throughout   .  Labs:  Lab Results  Component Value Date  WBC 6.5 02/28/2022   HGB 14.5 02/28/2022   HCT 42.5 02/28/2022   MCV 91 02/28/2022   PLT 251 02/28/2022    Lab Results  Component Value Date   CREATININE 0.71 02/28/2022   BUN 6 02/28/2022   NA 141 02/28/2022   K 5.1 02/28/2022   CL 106 02/28/2022   CO2 24 02/28/2022    Lab Results  Component Value Date   ALT 10 02/28/2022   AST 17 02/28/2022   ALKPHOS 39 (L) 02/28/2022   BILITOT 0.6 02/28/2022    Lab Results  Component Value Date   TSH 0.812 02/28/2022     Assessment:   1. Need for hepatitis C screening test   2. Cervical cancer screening   3. Encounter for well woman exam with routine gynecological exam   4. Screening for diabetes mellitus (DM)   5. Screening cholesterol level      Plan:  Blood tests: Ordered. Breast self exam technique reviewed and patient encouraged to perform self-exam monthly. Contraception: OCP (estrogen/progesterone). Discussed healthy lifestyle modifications. Mammogram  Scheduled for 04/03/2023 Pap smear ordered. Follow up in 1 year for annual exam   Hildred Laser, MD [facility]

## 2023-04-02 NOTE — Patient Instructions (Addendum)
Preventive Care 40-41 Years Old, Female Preventive care refers to lifestyle choices and visits with your health care provider that can promote health and wellness. Preventive care visits are also called wellness exams. What can I expect for my preventive care visit? Counseling Your health care provider may ask you questions about your: Medical history, including: Past medical problems. Family medical history. Pregnancy history. Current health, including: Menstrual cycle. Method of birth control. Emotional well-being. Home life and relationship well-being. Sexual activity and sexual health. Lifestyle, including: Alcohol, nicotine or tobacco, and drug use. Access to firearms. Diet, exercise, and sleep habits. Work and work environment. Sunscreen use. Safety issues such as seatbelt and bike helmet use. Physical exam Your health care provider will check your: Height and weight. These may be used to calculate your BMI (body mass index). BMI is a measurement that tells if you are at a healthy weight. Waist circumference. This measures the distance around your waistline. This measurement also tells if you are at a healthy weight and may help predict your risk of certain diseases, such as type 2 diabetes and high blood pressure. Heart rate and blood pressure. Body temperature. Skin for abnormal spots. What immunizations do I need?  Vaccines are usually given at various ages, according to a schedule. Your health care provider will recommend vaccines for you based on your age, medical history, and lifestyle or other factors, such as travel or where you work. What tests do I need? Screening Your health care provider may recommend screening tests for certain conditions. This may include: Lipid and cholesterol levels. Diabetes screening. This is done by checking your blood sugar (glucose) after you have not eaten for a while (fasting). Pelvic exam and Pap test. Hepatitis B test. Hepatitis C  test. HIV (human immunodeficiency virus) test. STI (sexually transmitted infection) testing, if you are at risk. Lung cancer screening. Colorectal cancer screening. Mammogram. Talk with your health care provider about when you should start having regular mammograms. This may depend on whether you have a family history of breast cancer. BRCA-related cancer screening. This may be done if you have a family history of breast, ovarian, tubal, or peritoneal cancers. Bone density scan. This is done to screen for osteoporosis. Talk with your health care provider about your test results, treatment options, and if necessary, the need for more tests. Follow these instructions at home: Eating and drinking  Eat a diet that includes fresh fruits and vegetables, whole grains, lean protein, and low-fat dairy products. Take vitamin and mineral supplements as recommended by your health care provider. Do not drink alcohol if: Your health care provider tells you not to drink. You are pregnant, may be pregnant, or are planning to become pregnant. If you drink alcohol: Limit how much you have to 0-1 drink a day. Know how much alcohol is in your drink. In the [place] one drink equals one 12 oz bottle of beer (355 mL), one 5 oz glass of wine (148 mL), or one 1 oz glass of hard liquor (44 mL). Lifestyle Brush your teeth every morning and night with fluoride toothpaste. Floss one time each day. Exercise for at least 30 minutes 5 or more days each week. Do not use any products that contain nicotine or tobacco. These products include cigarettes, chewing tobacco, and vaping devices, such as e-cigarettes. If you need help quitting, ask your health care provider. Do not use drugs. If you are sexually active, practice safe sex. Use a condom or other form of protection to   prevent STIs. If you do not wish to become pregnant, use a form of birth control. If you plan to become pregnant, see your health care provider for a  prepregnancy visit. Take aspirin only as told by your health care provider. Make sure that you understand how much to take and what form to take. Work with your health care provider to find out whether it is safe and beneficial for you to take aspirin daily. Find healthy ways to manage stress, such as: Meditation, yoga, or listening to music. Journaling. Talking to a trusted person. Spending time with friends and family. Minimize exposure to UV radiation to reduce your risk of skin cancer. Safety Always wear your seat belt while driving or riding in a vehicle. Do not drive: If you have been drinking alcohol. Do not ride with someone who has been drinking. When you are tired or distracted. While texting. If you have been using any mind-altering substances or drugs. Wear a helmet and other protective equipment during sports activities. If you have firearms in your house, make sure you follow all gun safety procedures. Seek help if you have been physically or sexually abused. What's next? Visit your health care provider once a year for an annual wellness visit. Ask your health care provider how often you should have your eyes and teeth checked. Stay up to date on all vaccines. This information is not intended to replace advice given to you by your health care provider. Make sure you discuss any questions you have with your health care provider. Document Revised: 03/08/2021 Document Reviewed: 03/08/2021 Elsevier Patient Education  2024 Elsevier Inc. Breast Self-Awareness Breast self-awareness is knowing how your breasts look and feel. You need to: Check your breasts on a regular basis. Tell your doctor about any changes. Become familiar with the look and feel of your breasts. This can help you catch a breast problem while it is still small and can be treated. You should do breast self-exams even if you have breast implants. What you need: A mirror. A well-lit room. A pillow or other  soft object. How to do a breast self-exam Follow these steps to do a breast self-exam: Look for changes  Take off all the clothes above your waist. Stand in front of a mirror in a room with good lighting. Put your hands down at your sides. Compare your breasts in the mirror. Look for any difference between them, such as: A difference in shape. A difference in size. Wrinkles, dips, and bumps in one breast and not the other. Look at each breast for changes in the skin, such as: Redness. Scaly areas. Skin that has gotten thicker. Dimpling. Open sores (ulcers). Look for changes in your nipples, such as: Fluid coming out of a nipple. Fluid around a nipple. Bleeding. Dimpling. Redness. A nipple that looks pushed in (retracted), or that has changed position. Feel for changes Lie on your back. Feel each breast. To do this: Pick a breast to feel. Place a pillow under the shoulder closest to that breast. Put the arm closest to that breast behind your head. Feel the nipple area of that breast using the hand of your other arm. Feel the area with the pads of your three middle fingers by making small circles with your fingers. Use light, medium, and firm pressure. Continue the overlapping circles, moving downward over the breast. Keep making circles with your fingers. Stop when you feel your ribs. Start making circles with your fingers again, this time going   upward until you reach your collarbone. Then, make circles outward across your breast and into your armpit area. Squeeze your nipple. Check for discharge and lumps. Repeat these steps to check your other breast. Sit or stand in the tub or shower. With soapy water on your skin, feel each breast the same way you did when you were lying down. Write down what you find Writing down what you find can help you remember what to tell your doctor. Write down: What is normal for each breast. Any changes you find in each breast. These  include: The kind of changes you find. A tender or painful breast. Any lump you find. Write down its size and where it is. When you last had your monthly period (menstrual cycle). General tips If you are breastfeeding, the best time to check your breasts is after you feed your baby or after you use a breast pump. If you get monthly bleeding, the best time to check your breasts is 5-7 days after your monthly cycle ends. With time, you will become comfortable with the self-exam. You will also start to know if there are changes in your breasts. Contact a doctor if: You see a change in the shape or size of your breasts or nipples. You see a change in the skin of your breast or nipples, such as red or scaly skin. You have fluid coming from your nipples that is not normal. You find a new lump or thick area. You have breast pain. You have any concerns about your breast health. Summary Breast self-awareness includes looking for changes in your breasts and feeling for changes within your breasts. You should do breast self-awareness in front of a mirror in a well-lit room. If you get monthly periods (menstrual cycles), the best time to check your breasts is 5-7 days after your period ends. Tell your doctor about any changes you see in your breasts. Changes include changes in size, changes on the skin, painful or tender breasts, or fluid from your nipples that is not normal. This information is not intended to replace advice given to you by your health care provider. Make sure you discuss any questions you have with your health care provider. Document Revised: 02/15/2022 Document Reviewed: 07/13/2021 Elsevier Patient Education  2024 Elsevier Inc.  

## 2023-04-03 ENCOUNTER — Ambulatory Visit
Admission: RE | Admit: 2023-04-03 | Discharge: 2023-04-03 | Disposition: A | Discharge status: Home / Self Care | Payer: No Typology Code available for payment source | Source: Ambulatory Visit | Attending: Obstetrics and Gynecology | Admitting: Obstetrics and Gynecology

## 2023-04-03 DIAGNOSIS — Z1231 Encounter for screening mammogram for malignant neoplasm of breast: Secondary | ICD-10-CM | POA: Diagnosis present

## 2023-04-03 LAB — COMPREHENSIVE METABOLIC PANEL
ALT: 17 IU/L (ref 0–32)
AST: 19 IU/L (ref 0–40)
Albumin: 4.5 g/dL (ref 3.9–4.9)
Alkaline Phosphatase: 40 IU/L — ABNORMAL LOW (ref 44–121)
BUN/Creatinine Ratio: 9 (ref 9–23)
BUN: 8 mg/dL (ref 6–24)
Bilirubin Total: 0.7 mg/dL (ref 0.0–1.2)
CO2: 25 mmol/L (ref 20–29)
Calcium: 9.3 mg/dL (ref 8.7–10.2)
Chloride: 104 mmol/L (ref 96–106)
Creatinine, Ser: 0.85 mg/dL (ref 0.57–1.00)
Globulin, Total: 2.5 g/dL (ref 1.5–4.5)
Glucose: 81 mg/dL (ref 70–99)
Potassium: 4.9 mmol/L (ref 3.5–5.2)
Sodium: 140 mmol/L (ref 134–144)
Total Protein: 7 g/dL (ref 6.0–8.5)
eGFR: 88 mL/min/{1.73_m2} (ref >59)

## 2023-04-03 LAB — CBC
Hematocrit: 43.3 % (ref 34.0–46.6)
Hemoglobin: 14 g/dL (ref 11.1–15.9)
MCH: 30.7 pg (ref 26.6–33.0)
MCHC: 32.3 g/dL (ref 31.5–35.7)
MCV: 95 fL (ref 79–97)
Platelets: 235 10*3/uL (ref 150–450)
RBC: 4.56 x10E6/uL (ref 3.77–5.28)
RDW: 12 % (ref 11.7–15.4)
WBC: 5.6 10*3/uL (ref 3.4–10.8)

## 2023-04-03 LAB — LIPID PANEL
Chol/HDL Ratio: 3.6 ratio (ref 0.0–4.4)
Cholesterol, Total: 204 mg/dL — ABNORMAL HIGH (ref 100–199)
HDL: 57 mg/dL (ref >39)
LDL Chol Calc (NIH): 131 mg/dL — ABNORMAL HIGH (ref 0–99)
Triglycerides: 89 mg/dL (ref 0–149)
VLDL Cholesterol Cal: 16 mg/dL (ref 5–40)

## 2023-04-03 LAB — HEMOGLOBIN A1C
Est. average glucose Bld gHb Est-mCnc: 105 mg/dL
Hgb A1c MFr Bld: 5.3 % (ref 4.8–5.6)

## 2023-04-09 LAB — CYTOLOGY - PAP
Comment: NEGATIVE
Diagnosis: UNDETERMINED — AB
High risk HPV: NEGATIVE

## 2023-12-02 ENCOUNTER — Other Ambulatory Visit: Payer: Self-pay | Admitting: Obstetrics and Gynecology

## 2024-06-11 ENCOUNTER — Ambulatory Visit (INDEPENDENT_AMBULATORY_CARE_PROVIDER_SITE_OTHER)

## 2024-06-11 DIAGNOSIS — L820 Inflamed seborrheic keratosis: Secondary | ICD-10-CM

## 2024-06-11 DIAGNOSIS — L821 Other seborrheic keratosis: Secondary | ICD-10-CM | POA: Diagnosis not present

## 2024-06-11 DIAGNOSIS — W908XXA Exposure to other nonionizing radiation, initial encounter: Secondary | ICD-10-CM | POA: Diagnosis not present

## 2024-06-11 DIAGNOSIS — Z1283 Encounter for screening for malignant neoplasm of skin: Secondary | ICD-10-CM

## 2024-06-11 DIAGNOSIS — D229 Melanocytic nevi, unspecified: Secondary | ICD-10-CM

## 2024-06-11 DIAGNOSIS — L814 Other melanin hyperpigmentation: Secondary | ICD-10-CM | POA: Diagnosis not present

## 2024-06-11 DIAGNOSIS — D1801 Hemangioma of skin and subcutaneous tissue: Secondary | ICD-10-CM | POA: Diagnosis not present

## 2024-06-11 DIAGNOSIS — L578 Other skin changes due to chronic exposure to nonionizing radiation: Secondary | ICD-10-CM

## 2024-06-11 NOTE — Progress Notes (Signed)
    Subjective   Susan Hurst is a 43 y.o. female who presents for the following: Lesion(s) of concern . Patient is new patient  Today patient reports: Spot at upper chest, noticed changing last Wednesday , had been present since 2021 - 2022 , noticed raised but denies any itchiness or pain  Denies personal hx of skin cancer    Review of Systems:    No other skin or systemic complaints except as noted in HPI or Assessment and Plan.  The following portions of the chart were reviewed this encounter and updated as appropriate: medications, allergies, medical history  Relevant Medical History:  Reviewed   Objective  Well appearing patient in no apparent distress; mood and affect are within normal limits. Examination was performed of the:  Examination notable for: Angioma(s): Scattered red vascular papule(s)  , Lentigo/lentigines: Scattered pigmented macules that are tan to brown in color and are somewhat non-uniform in shape and concentrated in the sun-exposed areas, Nevus/nevi: Scattered well-demarcated, regular, pigmented macule(s) and/or papule(s)  , Seborrheic Keratosis(es): Stuck-on appearing keratotic papule(s) on the trunk, some  irritated with redness, crusting, edema, and/or partial avulsion, Actinic Damage/Elastosis: chronic sun damage: dyspigmentation, telangiectasia, and wrinkling  Examination limited by: Clothing   chest x 1 Erythematous stuck-on, waxy papule or plaque  Assessment & Plan   SKIN CANCER SCREENING PERFORMED TODAY.  BENIGN SKIN FINDINGS  - Lentigines  - Seborrheic keratoses  - Hemangiomas   - Nevus/Multiple Benign Nevi - Reassurance provided regarding the benign appearance of lesions noted on exam today; no treatment is indicated in the absence of symptoms/changes. - Reinforced importance of photoprotective strategies including liberal and frequent sunscreen use of a broad-spectrum SPF 30 or greater, use of protective clothing, and sun avoidance for  prevention of cutaneous malignancy and photoaging.  Counseled patient on the importance of regular self-skin monitoring as well as routine clinical skin examinations as scheduled.   ACTINIC DAMAGE - Chronic condition, secondary to cumulative UV/sun exposure - Recommend daily broad spectrum sunscreen SPF 30+ to sun-exposed areas, reapply every 2 hours as needed.  - Staying in the shade or wearing long sleeves, sun glasses (UVA+UVB protection) and wide brim hats (4-inch brim around the entire circumference of the hat) are also recommended for sun protection.  - Call for new or changing lesions.    Procedures, orders, diagnosis for this visit:  INFLAMED SEBORRHEIC KERATOSIS chest x 1 Symptomatic, irritating, patient would like treated. Destruction of lesion - chest x 1 Complexity: simple   Destruction method: cryotherapy   Informed consent: discussed and consent obtained   Timeout:  patient name, date of birth, surgical site, and procedure verified Lesion destroyed using liquid nitrogen: Yes   Region frozen until ice ball extended beyond lesion: Yes   Outcome: patient tolerated procedure well with no complications   Post-procedure details: wound care instructions given     Inflamed seborrheic keratosis -     Destruction of lesion    Return to clinic: Return in about 1 year (around 06/11/2025) for TBSE.  Documentation: I have reviewed the above documentation for accuracy and completeness, and I agree with the above.  I, Eleanor Blush, CMA, am acting as scribe for Lauraine JAYSON Kanaris, MD.   Lauraine JAYSON Kanaris, MD

## 2024-06-11 NOTE — Patient Instructions (Addendum)
 Recommend to call send my chart if not resolved in 2 months    Seborrheic Keratosis  What causes seborrheic keratoses? Seborrheic keratoses are harmless, common skin growths that first appear during adult life.  As time goes by, more growths appear.  Some people may develop a large number of them.  Seborrheic keratoses appear on both covered and uncovered body parts.  They are not caused by sunlight.  The tendency to develop seborrheic keratoses can be inherited.  They vary in color from skin-colored to gray, brown, or even black.  They can be either smooth or have a rough, warty surface.   Seborrheic keratoses are superficial and look as if they were stuck on the skin.  Under the microscope this type of keratosis looks like layers upon layers of skin.  That is why at times the top layer may seem to fall off, but the rest of the growth remains and re-grows.    Treatment Seborrheic keratoses do not need to be treated, but can easily be removed in the office.  Seborrheic keratoses often cause symptoms when they rub on clothing or jewelry.  Lesions can be in the way of shaving.  If they become inflamed, they can cause itching, soreness, or burning.  Removal of a seborrheic keratosis can be accomplished by freezing, burning, or surgery. If any spot bleeds, scabs, or grows rapidly, please return to have it checked, as these can be an indication of a skin cancer.  Cryotherapy Aftercare  Wash gently with soap and water everyday.   Apply Vaseline and Band-Aid daily until healed.     Due to recent changes in healthcare laws, you may see results of your pathology and/or laboratory studies on MyChart before the doctors have had a chance to review them. We understand that in some cases there may be results that are confusing or concerning to you. Please understand that not all results are received at the same time and often the doctors may need to interpret multiple results in order to provide you with the  best plan of care or course of treatment. Therefore, we ask that you please give us  2 business days to thoroughly review all your results before contacting the office for clarification. Should we see a critical lab result, you will be contacted sooner.   If You Need Anything After Your Visit  If you have any questions or concerns for your doctor, please call our main line at [phone] and press option 4 to reach your doctor's medical assistant. If no one answers, please leave a voicemail as directed and we will return your call as soon as possible. Messages left after 4 pm will be answered the following business day.   You may also send us  a message via MyChart. We typically respond to MyChart messages within 1-2 business days.  For prescription refills, please ask your pharmacy to contact our office. Our fax number is [phone].  If you have an urgent issue when the clinic is closed that cannot wait until the next business day, you can page your doctor at the number below.    Please note that while we do our best to be available for urgent issues outside of office hours, we are not available 24/7.   If you have an urgent issue and are unable to reach us , you may choose to seek medical care at your doctor's office, retail clinic, urgent care center, or emergency room.  If you have a medical emergency, please immediately call  911 or go to the emergency department.  Pager Numbers  - Dr. Hester: [phone]  - Dr. Jackquline: [phone]  - Dr. Claudene: [phone]   - Dr. Raymund: [phone]  In the event of inclement weather, please call our main line at [phone] for an update on the status of any delays or closures.  Dermatology Medication Tips: Please keep the boxes that topical medications come in in order to help keep track of the instructions about where and how to use these. Pharmacies typically print the medication instructions only on the boxes and not directly on the  medication tubes.   If your medication is too expensive, please contact our office at [phone] option 4 or send us  a message through MyChart.   We are unable to tell what your co-pay for medications will be in advance as this is different depending on your insurance coverage. However, we may be able to find a substitute medication at lower cost or fill out paperwork to get insurance to cover a needed medication.   If a prior authorization is required to get your medication covered by your insurance company, please allow us  1-2 business days to complete this process.  Drug prices often vary depending on where the prescription is filled and some pharmacies may offer cheaper prices.  The website www.goodrx.com contains coupons for medications through different pharmacies. The prices here do not account for what the cost may be with help from insurance (it may be cheaper with your insurance), but the website can give you the price if you did not use any insurance.  - You can print the associated coupon and take it with your prescription to the pharmacy.  - You may also stop by our office during regular business hours and pick up a GoodRx coupon card.  - If you need your prescription sent electronically to a different pharmacy, notify our office through [facility] or by phone at [phone] option 4.     Si Usted Necesita Algo Despus de Su Visita  Tambin puede enviarnos un mensaje a travs de Clinical cytogeneticist. Por lo general respondemos a los mensajes de MyChart en el transcurso de 1 a 2 das hbiles.  Para renovar recetas, por favor pida a su farmacia que se ponga en contacto con nuestra oficina. Randi lakes de fax es [place] [phone].  Si tiene un asunto urgente cuando la clnica est cerrada y que no puede esperar hasta el siguiente da hbil, puede llamar/localizar a su doctor(a) al nmero que aparece a continuacin.   Por favor, tenga en cuenta que aunque hacemos todo lo posible  para estar disponibles para asuntos urgentes fuera del horario de [place], no estamos disponibles las 24 horas del da, los 7 [address]  [address] de la [place].   Si tiene un problema urgente y no puede comunicarse con nosotros, puede optar por buscar atencin mdica  en el consultorio de su doctor(a), en una clnica privada, en un centro de atencin urgente o en una sala de emergencias.  Si tiene Engineer, drilling, por favor llame inmediatamente al 911 o vaya a la sala de emergencias.  Nmeros de bper  - Dr. Hester: [phone]  - Dra. Jackquline: [phone]  - Dr. Claudene: [phone]  - Dra. Kitts: [phone]  En caso de inclemencias del [place], por favor llame a nuestra lnea principal al [phone] para una actualizacin sobre el estado de cualquier retraso o cierre.  Consejos para la medicacin en dermatologa: Por favor, guarde las cajas en las que vienen los  medicamentos de uso tpico para ayudarle a seguir las [employer] dnde y cmo usarlos. Las farmacias generalmente imprimen las instrucciones del medicamento slo en las cajas y no directamente en los tubos del [place].   Si su medicamento es muy caro, por favor, pngase en contacto con landry rieger llamando al [phone] y presione la opcin 4 o envenos un mensaje a travs de Clinical cytogeneticist.   No podemos decirle cul ser su copago por los medicamentos por adelantado ya que esto es diferente dependiendo de la cobertura de su seguro. Sin embargo, es posible que podamos encontrar un medicamento sustituto a Audiological scientist un formulario para que el seguro cubra el medicamento que se considera necesario.   Si se requiere una autorizacin previa para que su compaa de seguros malta su medicamento, por favor permtanos de 1 a 2 das hbiles para completar este proceso.  Los precios de los medicamentos varan con frecuencia dependiendo del Environmental consultant de dnde se surte la receta y alguna farmacias pueden ofrecer precios ms  baratos.  El sitio web www.goodrx.com tiene cupones para medicamentos de Health and safety inspector. Los precios aqu no tienen en cuenta lo que podra costar con la ayuda del seguro (puede ser ms barato con su seguro), pero el sitio web puede darle el precio si no utiliz Tourist information centre manager.  - Puede imprimir el cupn correspondiente y llevarlo con su receta a la farmacia.  - Tambin puede pasar por nuestra oficina durante el horario de atencin regular y Education officer, museum una tarjeta de cupones de GoodRx.  - Si necesita que su receta se enve electrnicamente a una farmacia diferente, informe a nuestra oficina a travs de MyChart de [place] o por telfono llamando al [phone] y presione la opcin 4.

## 2024-09-21 ENCOUNTER — Other Ambulatory Visit (HOSPITAL_COMMUNITY)
Admission: RE | Admit: 2024-09-21 | Discharge: 2024-09-21 | Disposition: A | Discharge status: Home / Self Care | Source: Ambulatory Visit | Attending: Obstetrics & Gynecology | Admitting: Obstetrics & Gynecology

## 2024-09-21 ENCOUNTER — Ambulatory Visit: Payer: Self-pay | Admitting: Obstetrics & Gynecology

## 2024-09-21 VITALS — BP 124/85 | HR 55 | Ht 69.0 in | Wt 130.9 lb

## 2024-09-21 DIAGNOSIS — Z1231 Encounter for screening mammogram for malignant neoplasm of breast: Secondary | ICD-10-CM

## 2024-09-21 DIAGNOSIS — Z124 Encounter for screening for malignant neoplasm of cervix: Secondary | ICD-10-CM | POA: Diagnosis present

## 2024-09-21 DIAGNOSIS — Z0000 Encounter for general adult medical examination without abnormal findings: Secondary | ICD-10-CM

## 2024-09-21 DIAGNOSIS — Z01419 Encounter for gynecological examination (general) (routine) without abnormal findings: Secondary | ICD-10-CM | POA: Diagnosis not present

## 2024-09-21 MED ORDER — JUNEL 1.5/30 1.5-30 MG-MCG PO TABS: 1.0000 | ORAL_TABLET | Freq: Every day | ORAL | 6 refills | Status: AC

## 2024-09-21 NOTE — Progress Notes (Signed)
 "   GYNECOLOGY ANNUAL PHYSICAL EXAM PROGRESS NOTE  Subjective:    Susan Hurst is a 43 y.o. married [phone]  (11 and 19 yo kids) who presents for an annual exam.  The patient is sexually active. The patient participates in regular exercise: yes. Has the patient ever been transfused or tattooed?: yes. The patient reports that there is not domestic violence in her life.   The patient has the following complaints today: She would a refill of her continuous OCPs and wants lab work.  Menstrual History: Menarche age: 15 Patient's last menstrual period was 08/03/2024 (approximate).     Gynecologic History:  Contraception: OCP (estrogen/progesterone) History of STI's:  Last Pap: 2024. Results were: abnormal.  ASCUS, HR HPV negative.   Last mammogram: last year      OB History  Gravida Para Term Preterm AB Living  3 2 1 1 1 2   SAB IAB Ectopic Multiple Live Births  1 0 0 0 2    # Outcome Date GA Lbr Len/2nd Weight Sex Type Anes PTL Lv  3 Term 06/15/16 [redacted]w[redacted]d 02:15 / 00:24 6 lb 4.2 oz (2.84 kg) F Vag-Spont None  LIV     Name: CORNELIUS, SCHUITEMA     Apgar1: 8  Apgar5: 9  2 SAB 08/2015          1 Preterm 12/06/12 [redacted]w[redacted]d 01:20 / 00:28 5 lb 11.8 oz (2.603 kg) F Vag-Spont Local  LIV     Birth Comments: caput, brusing L forehead     Name: Hord,GIRL Darrelyn     Apgar1: 7  Apgar5: 8    Obstetric Comments  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/24/2010   Qualifier: Diagnosis of  By: Jenetta MD, Talia      Past Surgical History:  Procedure Laterality Date   WISDOM TOOTH EXTRACTION  2001    Family History  Problem Relation Age of Onset   Hyperlipidemia Father    Hypertension Father    Polycystic ovary  syndrome Sister    Hypothyroidism Sister    Hydrocephalus Sister    Bladder Cancer Maternal Uncle    Prostate cancer Maternal Uncle    Stomach cancer Paternal Grandfather    Hypothyroidism Other    Heart disease Neg Hx    Breast cancer Neg Hx     Social History   Socioeconomic History   Marital status: Married    Spouse name: Not on file   Number of children: Not on file   Years of education: Not on file   Highest education level: Not on file  Occupational History   Occupation: Doctor, general practice    Employer: [employer]  Tobacco Use   Smoking status: Never   Smokeless tobacco: Never  Vaping Use   Vaping status: Never Used  Substance and Sexual Activity   Alcohol use: Yes    Comment: occcas   Drug use: No   Sexual activity: Yes    Birth control/protection: Pill  Other Topics Concern   Not on file  Social History Narrative   Not on file   Social Drivers of Health   Tobacco Use: Low Risk (  06/11/2024)   Patient History    Smoking Tobacco Use: Never    Smokeless Tobacco Use: Never    Passive Exposure: Not on file  Financial Resource Strain: Not on file  Food Insecurity: Not on file  Transportation Needs: Not on file  Physical Activity: Not on file  Stress: Not on file  Social Connections: Not on file  Intimate Partner Violence: Not on file  Depression (EYV7-0): Not on file  Alcohol Screen: Not on file  Housing: Unknown (12/09/2023)   Received from [facility]   Epic    Unable to Pay for Housing in the Last Year: Not on file    Number of Times Moved in the Last Year: Not on file    At any time in the past 12 months, were you homeless or living in a shelter (including now)?: No  Utilities: Not on file  Health Literacy: Not on file    Medications Ordered Prior to Encounter[1]  Allergies[2]   Review of Systems Constitutional: negative for chills, fatigue, fevers and sweats Eyes: negative for irritation, redness and visual  disturbance Ears, nose, mouth, throat, and face: negative for hearing loss, nasal congestion, snoring and tinnitus Respiratory: negative for asthma, cough, sputum Cardiovascular: negative for chest pain, dyspnea, exertional chest pressure/discomfort, irregular heart beat, palpitations and syncope Gastrointestinal: negative for abdominal pain, change in bowel habits, nausea and vomiting Genitourinary: negative for abnormal menstrual periods, genital lesions, sexual problems and vaginal discharge, dysuria and urinary incontinence Integument/breast: negative for breast lump, breast tenderness and nipple discharge Hematologic/lymphatic: negative for bleeding and easy bruising Musculoskeletal:negative for back pain and muscle weakness Neurological: negative for dizziness, headaches, vertigo and weakness Endocrine: negative for diabetic symptoms including polydipsia, polyuria and skin dryness Allergic/Immunologic: negative for hay fever and urticaria      Objective:  Blood pressure 124/85, pulse (!) 55, height 5' 9 (1.753 m), weight 130 lb 14.4 oz (59.4 kg), last menstrual period 08/03/2024, currently breastfeeding. Body mass index is 19.33 kg/m.    General Appearance:    Alert, cooperative, no distress, appears stated age  Head:    Normocephalic, without obvious abnormality, atraumatic  Eyes:    PERRL, conjunctiva/corneas clear, EOM's intact, both eyes  Ears:    Normal external ear canals, both ears  Nose:   Nares normal, septum midline, mucosa normal, no drainage or sinus tenderness  Throat:   Lips, mucosa, and tongue normal; teeth and gums normal  Neck:   Supple, symmetrical, trachea midline, no adenopathy; thyroid : no enlargement/tenderness/nodules; no carotid bruit or JVD  Back:     Symmetric, no curvature, ROM normal, no CVA tenderness  Lungs:     Clear to auscultation bilaterally, respirations unlabored  Chest Wall:    No tenderness or deformity   Heart:    Regular rate and rhythm, S1  and S2 normal, no murmur, rub or gallop  Breast Exam:    No tenderness, masses, or nipple abnormality  Abdomen:     Soft, non-tender, bowel sounds active all four quadrants, no masses, no organomegaly.    Genitalia:    Pelvic:external genitalia normal, vagina without lesions, discharge, or tenderness, Cervix normal in appearance, no cervical motion tenderness, no adnexal masses or tenderness.  Uterus normal size, anteverted, normal shape, mobile, regular contours, nontender.     Extremities:   Extremities normal, atraumatic, no cyanosis or edema  Pulses:   2+ and symmetric all extremities  Skin:   Skin color, texture, turgor normal, no rashes or lesions  Lymph nodes:   Cervical, supraclavicular, and axillary nodes normal  Neurologic:   CNII-XII intact, normal strength, sensation and reflexes throughout   .  Labs:  Lab Results  Component Value Date   WBC 5.6 04/02/2023   HGB 14.0 04/02/2023   HCT 43.3 04/02/2023   MCV 95 04/02/2023   PLT 235 04/02/2023    Lab Results  Component Value Date   CREATININE 0.85 04/02/2023   BUN 8 04/02/2023   NA 140 04/02/2023   K 4.9 04/02/2023   CL 104 04/02/2023   CO2 25 04/02/2023    Lab Results  Component Value Date   ALT 17 04/02/2023   AST 19 04/02/2023   ALKPHOS 40 (L) 04/02/2023   BILITOT 0.7 04/02/2023    Lab Results  Component Value Date   TSH 0.812 02/28/2022     Assessment:   1. Encounter for well woman exam with routine gynecological exam   2. Cervical cancer screening      Plan:  Blood tests: routine labs. Breast self exam technique reviewed and patient encouraged to perform self-exam monthly. Contraception: OCP (estrogen/progesterone), continuous refilled Discussed healthy lifestyle modifications. Mammogram ordered Pap smear ordered.  Follow up in 1 year for annual exam   Starla Harland BROCKS, MD [facility]    [1]  Current Outpatient Medications on File Prior to Visit  Medication Sig Dispense Refill    ibuprofen  (ADVIL ) 200 MG tablet Take 200 mg by mouth every 6 (six) hours as needed. As needed     JUNEL  1.5/30 1.5-30 MG-MCG tablet TAKE 1 TABLET BY MOUTH DAILY. TAKE CONTINUOUSLY. SKIP PLACEBO PILLS 84 tablet 2   No current facility-administered medications on file prior to visit.  [2]  Allergies Allergen Reactions   Penicillins Hives and Rash   "

## 2024-09-22 LAB — CBC
Hematocrit: 44.2 % (ref 34.0–46.6)
Hemoglobin: 14.5 g/dL (ref 11.1–15.9)
MCH: 31.3 pg (ref 26.6–33.0)
MCHC: 32.8 g/dL (ref 31.5–35.7)
MCV: 95 fL (ref 79–97)
Platelets: 258 x10E3/uL (ref 150–450)
RBC: 4.64 x10E6/uL (ref 3.77–5.28)
RDW: 11.9 % (ref 11.7–15.4)
WBC: 3.6 x10E3/uL (ref 3.4–10.8)

## 2024-09-22 LAB — COMPREHENSIVE METABOLIC PANEL WITH GFR
ALT: 21 IU/L (ref 0–32)
AST: 27 IU/L (ref 0–40)
Albumin: 4.2 g/dL (ref 3.9–4.9)
Alkaline Phosphatase: 45 IU/L (ref 41–116)
BUN/Creatinine Ratio: 13 (ref 9–23)
BUN: 8 mg/dL (ref 6–24)
Bilirubin Total: 0.3 mg/dL (ref 0.0–1.2)
CO2: 22 mmol/L (ref 20–29)
Calcium: 9.1 mg/dL (ref 8.7–10.2)
Chloride: 102 mmol/L (ref 96–106)
Creatinine, Ser: 0.62 mg/dL (ref 0.57–1.00)
Globulin, Total: 2.5 g/dL (ref 1.5–4.5)
Glucose: 88 mg/dL (ref 70–99)
Potassium: 4.3 mmol/L (ref 3.5–5.2)
Sodium: 138 mmol/L (ref 134–144)
Total Protein: 6.7 g/dL (ref 6.0–8.5)
eGFR: 114 mL/min/1.73

## 2024-09-22 LAB — TSH+FREE T4
Free T4: 1.08 ng/dL (ref 0.82–1.77)
TSH: 1.69 u[IU]/mL (ref 0.450–4.500)

## 2024-09-22 LAB — LIPID PANEL
Chol/HDL Ratio: 4.3 ratio (ref 0.0–4.4)
Cholesterol, Total: 169 mg/dL (ref 100–199)
HDL: 39 mg/dL — ABNORMAL LOW
LDL Chol Calc (NIH): 108 mg/dL — ABNORMAL HIGH (ref 0–99)
Triglycerides: 122 mg/dL (ref 0–149)
VLDL Cholesterol Cal: 22 mg/dL (ref 5–40)

## 2024-09-22 LAB — HEMOGLOBIN A1C
Est. average glucose Bld gHb Est-mCnc: 111 mg/dL
Hgb A1c MFr Bld: 5.5 % (ref 4.8–5.6)

## 2024-09-25 LAB — CYTOLOGY - PAP
Comment: NEGATIVE
High risk HPV: NEGATIVE

## 2024-09-29 ENCOUNTER — Ambulatory Visit: Payer: Self-pay | Admitting: Obstetrics & Gynecology

## 2024-09-30 ENCOUNTER — Other Ambulatory Visit: Payer: Self-pay | Admitting: Obstetrics & Gynecology

## 2024-09-30 DIAGNOSIS — Z1231 Encounter for screening mammogram for malignant neoplasm of breast: Secondary | ICD-10-CM

## 2024-10-09 ENCOUNTER — Ambulatory Visit
Admission: RE | Admit: 2024-10-09 | Discharge: 2024-10-09 | Disposition: A | Discharge status: Home / Self Care | Source: Ambulatory Visit | Attending: Obstetrics & Gynecology | Admitting: Obstetrics & Gynecology

## 2024-10-09 DIAGNOSIS — Z1231 Encounter for screening mammogram for malignant neoplasm of breast: Secondary | ICD-10-CM | POA: Insufficient documentation

## 2024-11-09 ENCOUNTER — Encounter: Admitting: Obstetrics & Gynecology

## 2025-07-12 ENCOUNTER — Encounter
